# Patient Record
Sex: Female | Born: 1965 | Race: Black or African American | Hispanic: No | Marital: Married | Smoking: Never smoker
Health system: Southern US, Community
[De-identification: ages and names within clinical notes are randomized; demographics above are authoritative.]

## PROBLEM LIST (undated history)

## (undated) DIAGNOSIS — I10 Essential (primary) hypertension: Secondary | ICD-10-CM

## (undated) DIAGNOSIS — I499 Cardiac arrhythmia, unspecified: Secondary | ICD-10-CM

## (undated) DIAGNOSIS — F419 Anxiety disorder, unspecified: Secondary | ICD-10-CM

## (undated) DIAGNOSIS — R112 Nausea with vomiting, unspecified: Secondary | ICD-10-CM

## (undated) DIAGNOSIS — F329 Major depressive disorder, single episode, unspecified: Secondary | ICD-10-CM

## (undated) DIAGNOSIS — F32A Depression, unspecified: Secondary | ICD-10-CM

## (undated) DIAGNOSIS — E669 Obesity, unspecified: Secondary | ICD-10-CM

## (undated) DIAGNOSIS — M1990 Unspecified osteoarthritis, unspecified site: Secondary | ICD-10-CM

## (undated) DIAGNOSIS — Z8669 Personal history of other diseases of the nervous system and sense organs: Secondary | ICD-10-CM

## (undated) DIAGNOSIS — K219 Gastro-esophageal reflux disease without esophagitis: Secondary | ICD-10-CM

## (undated) DIAGNOSIS — M549 Dorsalgia, unspecified: Secondary | ICD-10-CM

## (undated) DIAGNOSIS — G4730 Sleep apnea, unspecified: Secondary | ICD-10-CM

## (undated) DIAGNOSIS — Z9889 Other specified postprocedural states: Secondary | ICD-10-CM

## (undated) HISTORY — DX: Personal history of other diseases of the nervous system and sense organs: Z86.69

## (undated) HISTORY — DX: Anxiety disorder, unspecified: F41.9

## (undated) HISTORY — DX: Obesity, unspecified: E66.9

## (undated) HISTORY — DX: Unspecified osteoarthritis, unspecified site: M19.90

## (undated) HISTORY — PX: CHOLECYSTECTOMY: SHX55

## (undated) HISTORY — DX: Major depressive disorder, single episode, unspecified: F32.9

## (undated) HISTORY — DX: Depression, unspecified: F32.A

## (undated) HISTORY — PX: HERNIA REPAIR: SHX51

## (undated) HISTORY — DX: Essential (primary) hypertension: I10

---

## 1998-02-07 ENCOUNTER — Other Ambulatory Visit: Admission: RE | Admit: 1998-02-07 | Discharge: 1998-02-07 | Payer: Self-pay | Admitting: Family Medicine

## 1999-01-22 ENCOUNTER — Other Ambulatory Visit: Admission: RE | Admit: 1999-01-22 | Discharge: 1999-01-22 | Payer: Self-pay | Admitting: *Deleted

## 2000-03-05 ENCOUNTER — Other Ambulatory Visit: Admission: RE | Admit: 2000-03-05 | Discharge: 2000-03-05 | Payer: Self-pay | Admitting: Family Medicine

## 2000-11-06 ENCOUNTER — Encounter: Admission: RE | Admit: 2000-11-06 | Discharge: 2000-11-06 | Payer: Self-pay | Admitting: Family Medicine

## 2000-11-06 ENCOUNTER — Encounter: Payer: Self-pay | Admitting: Family Medicine

## 2001-03-23 ENCOUNTER — Other Ambulatory Visit: Admission: RE | Admit: 2001-03-23 | Discharge: 2001-03-23 | Payer: Self-pay | Admitting: Family Medicine

## 2002-01-14 ENCOUNTER — Encounter: Admission: RE | Admit: 2002-01-14 | Discharge: 2002-01-14 | Payer: Self-pay | Admitting: Orthopedic Surgery

## 2002-02-24 ENCOUNTER — Encounter: Payer: Self-pay | Admitting: Orthopedic Surgery

## 2002-02-24 ENCOUNTER — Encounter: Admission: RE | Admit: 2002-02-24 | Discharge: 2002-02-24 | Payer: Self-pay | Admitting: Orthopedic Surgery

## 2002-09-19 ENCOUNTER — Other Ambulatory Visit: Admission: RE | Admit: 2002-09-19 | Discharge: 2002-09-19 | Payer: Self-pay | Admitting: Family Medicine

## 2003-09-26 ENCOUNTER — Other Ambulatory Visit: Admission: RE | Admit: 2003-09-26 | Discharge: 2003-09-26 | Payer: Self-pay | Admitting: Emergency Medicine

## 2003-12-25 ENCOUNTER — Encounter: Admission: RE | Admit: 2003-12-25 | Discharge: 2003-12-25 | Payer: Self-pay | Admitting: Psychiatry

## 2004-11-20 ENCOUNTER — Other Ambulatory Visit: Admission: RE | Admit: 2004-11-20 | Discharge: 2004-11-20 | Payer: Self-pay | Admitting: Family Medicine

## 2004-12-09 ENCOUNTER — Encounter: Admission: RE | Admit: 2004-12-09 | Discharge: 2004-12-09 | Payer: Self-pay | Admitting: Family Medicine

## 2004-12-20 ENCOUNTER — Encounter: Admission: RE | Admit: 2004-12-20 | Discharge: 2004-12-20 | Payer: Self-pay | Admitting: Emergency Medicine

## 2005-06-26 ENCOUNTER — Encounter: Admission: RE | Admit: 2005-06-26 | Discharge: 2005-06-26 | Payer: Self-pay | Admitting: Family Medicine

## 2005-09-16 ENCOUNTER — Emergency Department (HOSPITAL_COMMUNITY): Admission: EM | Admit: 2005-09-16 | Discharge: 2005-09-16 | Payer: Self-pay | Admitting: *Deleted

## 2005-12-30 ENCOUNTER — Encounter: Admission: RE | Admit: 2005-12-30 | Discharge: 2005-12-30 | Payer: Self-pay | Admitting: Family Medicine

## 2006-01-13 ENCOUNTER — Other Ambulatory Visit: Admission: RE | Admit: 2006-01-13 | Discharge: 2006-01-13 | Payer: Self-pay | Admitting: Family Medicine

## 2006-10-22 ENCOUNTER — Encounter: Admission: RE | Admit: 2006-10-22 | Discharge: 2006-10-22 | Payer: Self-pay | Admitting: Family Medicine

## 2006-11-04 ENCOUNTER — Ambulatory Visit (HOSPITAL_COMMUNITY): Admission: RE | Admit: 2006-11-04 | Discharge: 2006-11-04 | Payer: Self-pay | Admitting: General Surgery

## 2006-11-04 ENCOUNTER — Encounter (INDEPENDENT_AMBULATORY_CARE_PROVIDER_SITE_OTHER): Payer: Self-pay | Admitting: Specialist

## 2007-02-03 ENCOUNTER — Encounter: Admission: RE | Admit: 2007-02-03 | Discharge: 2007-02-03 | Payer: Self-pay | Admitting: Family Medicine

## 2007-05-06 ENCOUNTER — Other Ambulatory Visit: Admission: RE | Admit: 2007-05-06 | Discharge: 2007-05-06 | Payer: Self-pay | Admitting: Family Medicine

## 2007-07-21 ENCOUNTER — Ambulatory Visit (HOSPITAL_COMMUNITY): Admission: RE | Admit: 2007-07-21 | Discharge: 2007-07-21 | Payer: Self-pay | Admitting: Family Medicine

## 2007-07-21 ENCOUNTER — Ambulatory Visit: Payer: Self-pay | Admitting: Vascular Surgery

## 2008-07-06 ENCOUNTER — Other Ambulatory Visit: Admission: RE | Admit: 2008-07-06 | Discharge: 2008-07-06 | Payer: Self-pay | Admitting: Family Medicine

## 2008-07-20 ENCOUNTER — Encounter: Admission: RE | Admit: 2008-07-20 | Discharge: 2008-07-20 | Payer: Self-pay | Admitting: Family Medicine

## 2008-11-27 ENCOUNTER — Encounter
Admission: RE | Admit: 2008-11-27 | Discharge: 2008-11-27 | Payer: Self-pay | Admitting: Physical Medicine and Rehabilitation

## 2009-08-15 ENCOUNTER — Other Ambulatory Visit: Admission: RE | Admit: 2009-08-15 | Discharge: 2009-08-15 | Payer: Self-pay | Admitting: Family Medicine

## 2009-09-12 ENCOUNTER — Encounter: Admission: RE | Admit: 2009-09-12 | Discharge: 2009-09-12 | Payer: Self-pay | Admitting: Family Medicine

## 2009-12-11 ENCOUNTER — Encounter: Payer: Self-pay | Admitting: Internal Medicine

## 2009-12-11 ENCOUNTER — Encounter: Admission: RE | Admit: 2009-12-11 | Discharge: 2009-12-11 | Payer: Self-pay | Admitting: Family Medicine

## 2009-12-24 ENCOUNTER — Encounter: Admission: RE | Admit: 2009-12-24 | Discharge: 2009-12-24 | Payer: Self-pay | Admitting: Family Medicine

## 2009-12-24 ENCOUNTER — Encounter: Payer: Self-pay | Admitting: Internal Medicine

## 2009-12-27 ENCOUNTER — Encounter: Payer: Self-pay | Admitting: Internal Medicine

## 2010-01-09 ENCOUNTER — Ambulatory Visit: Payer: Self-pay | Admitting: Internal Medicine

## 2010-01-09 DIAGNOSIS — J309 Allergic rhinitis, unspecified: Secondary | ICD-10-CM | POA: Insufficient documentation

## 2010-01-09 DIAGNOSIS — I10 Essential (primary) hypertension: Secondary | ICD-10-CM | POA: Insufficient documentation

## 2010-01-09 DIAGNOSIS — G479 Sleep disorder, unspecified: Secondary | ICD-10-CM | POA: Insufficient documentation

## 2010-01-09 DIAGNOSIS — J45909 Unspecified asthma, uncomplicated: Secondary | ICD-10-CM | POA: Insufficient documentation

## 2010-01-09 DIAGNOSIS — R062 Wheezing: Secondary | ICD-10-CM

## 2010-01-09 DIAGNOSIS — R05 Cough: Secondary | ICD-10-CM

## 2010-01-11 ENCOUNTER — Encounter: Payer: Self-pay | Admitting: Pulmonary Disease

## 2010-10-17 ENCOUNTER — Encounter
Admission: RE | Admit: 2010-10-17 | Discharge: 2010-10-17 | Payer: Self-pay | Source: Home / Self Care | Attending: Family Medicine | Admitting: Family Medicine

## 2010-11-21 NOTE — Letter (Signed)
Summary: [facility]   Imported By: Sherian Rein 01/17/2010 07:10:45  _____________________________________________________________________  External Attachment:    Type:   Image     Comment:   External Document

## 2010-11-21 NOTE — Letter (Signed)
SummaryScience writer Pulmonary Care Appointment Letter  [facility]  [address]   [place], [place] [address]   Phone: [phone]  Fax: [phone]    01/11/2010 MRN: [id number]  Alexis Cook [address] [place], [place]  [address]  Dear [name],   Our office is attempting to contact you about an appointment.  Please call our office at [phone] to schedule this appointment with Dr.________________.  Our registration staff is prepared to assist you with any questions you may have.    Thank you,   Nature conservation officer Pulmonary Division

## 2010-11-21 NOTE — Letter (Signed)
Summary: [facility]   Imported By: Sherian Rein 01/17/2010 07:09:57  _____________________________________________________________________  External Attachment:    Type:   Image     Comment:   External Document

## 2010-11-21 NOTE — Letter (Signed)
Summary: [facility]   Imported By: Sherian Rein 01/17/2010 07:08:49  _____________________________________________________________________  External Attachment:    Type:   Image     Comment:   External Document

## 2010-11-21 NOTE — Assessment & Plan Note (Signed)
Summary: asthma, bronchitis/jd   Visit Type:  Initial Consult Copy to:  Carilyn Goodpasture Primary Provider/Referring Provider:  Marcelyn Bruins  CC:  Pulmonary consult for wheezing and SOB with activity. .  History of Present Illness: IOV 01/09/2010. c/o dyspnea. Referred for same. Felt like lung was 'covered with duct tape' or 'being strangled at bottom of my windpipe in neck". Felt there was  a "knot in my neck'. Felt 'awful'.  Acute onset. Remembers date of 11/30/2009. Recollects that sick contact at work the prior day 11/29/2009. Felt she started feeling sore throat, tiredness and sinsus headaches, and felt she was getting the worst cold of her life. Same day she developed dyspnea,  associated significant wheeze, cough, insomnia, headaches, and fatigue present. Symptoms wre relieved initially by albuterol but later needed duoneb that she was needing very frequently. Did not think prednisone helped but mucinex seems to have helped. Currently dyspnea, tiredness and choking symptoms are completely resolved. But still coughing and with sinus drainage. Wheezing mostly resolved except one occassional episode today.   Current cough is mostly dry, with occassonally white clear light yellow sputum. Mild cough only. Associated mild-moderate sinus drainage still present. Currently cough and sinus drainage improving. No clear cut aggravating or relieving factors other than mucinex and pasage of time.   Notes childhood history of asthma but outgrew it other than the fact that typical colds tend to be more severe in her comparted to "normal people'. The exception is this time when symptoms of above started.   Note she has a lisinopril bottle from October 2010 which she states she got only '76month ago" but insists she never took it. She also has atenolol bottle that she claims she took for 2 weeks only back in April 2010. She is only on HCTZ for bp control. Did not take these meds during current illness.    Preventive Screening-Counseling & Management  Alcohol-Tobacco     Smoking Status: quit  Current Medications (verified): 1)  Hydrochlorothiazide 25 Mg Tabs (Hydrochlorothiazide) .... Take 1 Tablet By Mouth Once A Day 2)  Alprazolam 0.25 Mg Tabs (Alprazolam) .... Take 1 Tablet By Mouth Three Times A Day As Needed 3)  Citalopram Hydrobromide 20 Mg Tabs (Citalopram Hydrobromide) .... Take 1 Tablet By Mouth Once A Day 4)  Atenolol 50 Mg Tabs (Atenolol) .... Take 1 Tablet By Mouth Once A Day 5)  Meloxicam 15 Mg Tabs (Meloxicam) .... Take 1 Tablet By Mouth Once A Day 6)  Symbicort 160-4.5 Mcg/act Aero (Budesonide-Formoterol Fumarate) .... 2 Puffs Twice Daily  Allergies (verified): 1)  ! Vicodin 2)  ! Vioxx  Past History:  Family History: Last updated: 01/09/2010 Mother-allergies, asthma Uncle-empysema  Social History: Last updated: 01/09/2010 Patient states former smoker.  Pt states hse tried smoking as a teen.  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 Wears mask at work Works 1st shift or late night shift as well  Risk Factors: Smoking Status: quit (01/09/2010)  Past Medical History: Allergic Rhinitis Childhood Asthma outgrew Hypertension Insomnia, unspecified 780.52 Low back pain 724.2 Sleep study 2009 - mild OSA per patient.  Morbid Obesity  Past Surgical History: Cholecystectomy 2009  Family History: Mother-allergies, asthma Uncle-empysema  Social History: Patient states former smoker.  Pt states hse tried smoking as a teen.  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 Wears mask at work Works 1st shift or late night shift as wellSmoking Status:  quit  Review of Systems       The patient complains of shortness of  breath with activity, shortness of breath at rest, productive cough, non-productive cough, chest pain, irregular heartbeats, acid heartburn, abdominal pain, difficulty swallowing, sore throat, headaches, nasal congestion/difficulty breathing through nose, sneezing, ear ache, hand/feet swelling, joint stiffness or pain, and change in color of mucus.  The patient denies coughing up blood, indigestion, loss of appetite, weight change, tooth/dental problems, itching, anxiety, depression, rash, and fever.         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  Comments Medications reviewed with patient Carron Curie CMA  January 09, 2010 12:02 PM Daytime phone number verified with patient.    Physical Exam  General:  obese.   Head:  normocephalic and atraumatic Eyes:  PERRLA/EOM intact; conjunctiva and sclera clear Ears:  TMs intact and clear with normal canals Nose:  no deformity, discharge, inflammation, or lesions Mouth:  no deformity or lesions Neck:  no masses, thyromegaly, or abnormal cervical nodes Chest Wall:  no deformities noted Lungs:  clear bilaterally to auscultation and percussion Heart:  regular rate and rhythm, S1, S2 without murmurs, rubs, gallops, or clicks Abdomen:  bowel sounds positive; abdomen soft and non-tender without masses, or organomegaly Msk:  no deformity or scoliosis noted with normal posture Pulses:  pulses normal Extremities:  no clubbing, cyanosis, edema, or deformity noted Neurologic:  CN II-XII grossly intact with normal reflexes,  coordination, muscle strength and tone Skin:  intact without lesions or rashes Cervical Nodes:  no significant adenopathy Axillary Nodes:  no significant adenopathy Psych:  alert and cooperative; normal mood and affect; normal attention span and concentration   CXR  Procedure date:  12/24/2009  Findings:       Ordered By: Ane Payment,        Ordered Date: 7 Mar11 15:50  Facility: [facility]                              Department: DG  Service Report Text  DRI Accession Number: [id number]      Clinical Data: Cough and congestion with wheezing and shortness of   breath.    CHEST - 2 VIEW    Comparison: 12/11/2009    Findings: Trachea is midline.  Heart size normal.  Lungs are clear.   No pleural fluid.    IMPRESSION:   No acute findings.    Read By:  Reyes Ivan.,  M.D.   Released By:  Reyes Ivan  Impression & Recommendations:  Problem # 1:  COUGH (ICD-786.2) Assessment New SOunds like she had post viral reactive cough  and wheeze and dyspnea. Now all resolved except cough that in part is post viral in etiology and in part due to sinsu drainage. Encouraged patience, netti pot, nasal steroids. Will review in 1 month and depenidng on that get PFTs. 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 Morbid obesity. Hypertensive. Reported sleep study  1 yeara ago outside shows mild sleep apnea only. IN order to improve sleep hygiene and reivew her sleep study will refer to Dr. Craige Cotta for sleep consultation Orders: Sleep Disorder Referral (Sleep Disorder) Consultation Level V (30865)  Problem # 4:  HYPERTENSION (ICD-401.9) Assessment: New  avoid non specific betablocker and ace inhibitors for hypertension control Her updated  medication list for this problem includes:    Hydrochlorothiazide 25 Mg Tabs (Hydrochlorothiazide) .Marland Kitchen... Take 1 tablet by mouth once a day  Orders: Consultation Level V (78469)  Medications Added to Medication List This Visit: 1)  Hydrochlorothiazide 25 Mg Tabs (Hydrochlorothiazide) .... Take 1 tablet by mouth once a day 2)  Alprazolam 0.25 Mg Tabs (Alprazolam) .... Take 1 tablet by mouth three times a day as needed 3)  Citalopram Hydrobromide 20 Mg Tabs (Citalopram hydrobromide) .... Take 1 tablet by mouth once a day 4)  Meloxicam 15 Mg Tabs (Meloxicam) .... Take 1 tablet by mouth once a day 5)  Symbicort 160-4.5 Mcg/act Aero (Budesonide-formoterol fumarate) .... 2 puffs twice daily 6)  Fluticasone Propionate 50 Mcg/act Susp (Fluticasone propionate) .... 2 sprays each nostril bid  Patient Instructions: 1)  Take sample nasal steroid (2) from our office 2)  Use netti pot saline wash  daily 3)  Monitor your symptoms 4)  Return to see me in next month to see progress 5)  See Dr. Craige Cotta for sleep hygiene issues 6)  For hypertension, avoid beta blockers and ace inhibitors  7)  Other types of bp meds - calcium channel blockers and ARBs are better Prescriptions: FLUTICASONE PROPIONATE 50 MCG/ACT SUSP (FLUTICASONE PROPIONATE) 2 sprays each nostril bid  #1 x 1   Entered and Authorized by:   Kalman Shan MD   Signed by:   Kalman Shan MD on 01/09/2010   Method used:   Print then Give to Patient   RxID:   [id number]

## 2011-03-07 NOTE — Op Note (Signed)
Alexis Cook, Alexis Cook             ACCOUNT NO.:  [id number]   MEDICAL RECORD NO.:  [id number]          PATIENT TYPE:  AMB   LOCATION:  DAY                          FACILITY:  [facility]   PHYSICIAN:  Ollen Gross. Vernell Morgans, M.D. DATE OF BIRTH:  1966-01-09   DATE OF PROCEDURE:  11/04/2006  DATE OF DISCHARGE:                               OPERATIVE REPORT   PREOPERATIVE DIAGNOSIS:  Gallstones.   POSTOPERATIVE DIAGNOSES:  1. Cholecystitis with cholelithiasis.  2. Umbilical hernia.   PROCEDURES:  1. Laparoscopic cholecystectomy.  2. Repair of umbilical hernia   SURGEON:  Ollen Gross. Vernell Morgans, M.D.   ASSISTANT:  Ardeth Sportsman, MD   ANESTHESIA:  General endotracheal.   PROCEDURE:  After informed consent was obtained, the patient was brought  to the operating room, and placed in the supine position on the  operating table.  After induction of general anesthesia, the patient's  abdomen was prepped with Betadine and draped in the usual sterile  manner.  The area below the umbilicus was infiltrated with 1/4%  Marcaine.  A small incision was made with a 15-blade knife.  This  incision was carried down through the subcutaneous tissue, bluntly with  the hemostat and Army-Navy retractors until the linea alba was  identified.  The linea alba was incised with a 15-blade knife and each  side was grasped with Kocher clamps, and elevated anteriorly.  The  preperitoneal space was then probed bluntly with the hemostat until the  peritoneum was opened; and access was gained to the abdominal cavity.  Then a #0 Vicryl pursestring stitch was placed in the fascia around the  opening.  The Hasson cannula was placed through the opening and anchored  in place with the previous Vicryl pursestring stitch.  The abdomen was  then insufflated with carbon dioxide without difficulty.  The patient  was placed in the head-up position and rotated slightly with the right  side up.  The laparoscope was inserted through the  Hasson cannula and  the right upper quadrant was inspected.  The dome of the gallbladder and  liver readily identified.  Next, the epigastric region was infiltrated  with 1/4% Marcaine; and a small incision was made with a 15-blade knife  and a 10 mm port was placed bluntly, through this incision into the  abdominal cavity under direct vision.   Sites were then chosen lateral on the right side of the abdomen for the  replacement of 5-mm ports.  Each of these areas was infiltrated with  1/4% Marcaine.  Small stab incisions were made with a 15-blade knife;  and 5-mm ports were placed bluntly through these incisions into the  abdominal cavity under direct vision.  A blunt grasper was placed  through the lateral most 5-mm port and the gallbladder was identified.  The gallbladder was very inflamed, distended and tight.  Because of this  an aspirating device was placed through the epigastric port; and the  dome of the gallbladder was punctured with the aspirating device and  aspirated, so that it could be maneuvered.  The lateral-most blunt  grasper was then  used to grasp the dome of gallbladder, and elevate it  anteriorly and superiorly.   Another blunt grasper was placed through the other 5-mm port and used to  retract on the body and neck of the gallbladder.  A dissector was placed  through the epigastric port; and the peritoneal reflection at the  gallbladder neck was opened.  Blunt dissection was then carried out in  this area; until the gallbladder neck and the cystic duct junction was  readily identified; and a good window was created.  The cystic duct  appeared to be short, but the anatomy was very clear.  A single clip was  placed on the gallbladder neck.  A small dichotomy was made just below  the clip with the laparoscopic scissors.  A 14-gauge Angiocath was  placed percutaneously through the anterior abdominal wall under direct  vision.  A Reddick cholangiogram catheter was placed  through the  Angiocath and flushed. Attempts were made to place the Reddick catheter  within the cystic duct, but these attempts were unsuccessful.  So as not  to injure the duct any further; and since the anatomy was very clear;  and her liver functions were normal.  We decided to abandon attempts to  obtain a cholangiogram.   Three clips were placed proximally on the cystic duct; and the duct was  divided between the two sets of clips.  Posterior to this the cystic  artery was identified; and, again, dissected bluntly in a  circumferential manner until a good window was created.  Two clips were  placed proximally, and one distally on the artery; and the artery was  divided between the two.   Next a laparoscopic hook cautery device was used to separate the  gallbladder from the liver bed prior to completely detaching the  gallbladder from the liver bed.  The liver bed was inspected and several  small bleeding points were coagulated with electrocautery until the area  was completely hemostatic.  Gallbladder was then detached the rest of  the way from the liver bed without difficulty.  A laparoscopic bag was  inserted through the epigastric port.  The gallbladder was placed within  the bag; and the bag was sealed.  The abdomen was then irrigated with  copious amounts of saline until the effluent was clear.  The liver bed  was inspected; and, again, was found to be hemostatic.  The laparoscope  was then moved to the epigastric port; and a gallbladder grasper was  placed through the Hasson cannula and used to grasp the open end of the  of the.  The bag with the gallbladder, and the Hassan cannula were  removed through the infraumbilical port without difficulty.  Because of  the size of the gallbladder, the infraumbilical incision did have to be  extended superiorly, a little bit.  Once the gallbladder was removed the abdominal wall was inspected; and there was a moderate-sized umbilical   hernia just to the superior edge of the infraumbilical incision.  Because of this the infraumbilical incision was extended up into the  umbilicus.  The fascial edges of the hernia defect were identified; and  dissected free.  This was done sharply with the electrocautery.  Once  this was accomplished, the entire fascial defect of the umbilical  hernia, and the fascial defect from the [facility] cannula were closed with  figure-of-eight #0 Vicryl stitches.   Once this was accomplished, the abdomen was then reinsufflated; and the  repair was inspected  from the inside; and it looked good.  The rest of  the ports were then removed under direct vision; and were found to be  hemostatic.  Gas was allowed to escape.  The skin incisions were all  closed with interrupted 4-0 Monocryl subcuticular stitches.  The  umbilical incision was closed with a running 4-0 Monocryl subcuticular  stitch.  Sterile dressings were then applied.  The patient tolerated the  procedure well.  At the end of the case all needle, sponge, and  instrument counts were correct.  The patient was then awakened, and  taken to recovery in stable condition.      Ollen Gross. Vernell Morgans, M.D.  Electronically Signed     PST/MEDQ  D:  11/04/2006  T:  11/04/2006  Job:  [id number]

## 2011-03-28 ENCOUNTER — Other Ambulatory Visit: Payer: Self-pay | Admitting: Sports Medicine

## 2011-03-28 DIAGNOSIS — M545 Low back pain: Secondary | ICD-10-CM

## 2011-03-28 DIAGNOSIS — M25562 Pain in left knee: Secondary | ICD-10-CM

## 2011-04-04 ENCOUNTER — Other Ambulatory Visit: Payer: Self-pay

## 2011-08-12 ENCOUNTER — Other Ambulatory Visit: Payer: Self-pay | Admitting: Family Medicine

## 2011-08-18 ENCOUNTER — Ambulatory Visit
Admission: RE | Admit: 2011-08-18 | Discharge: 2011-08-18 | Disposition: A | Discharge status: Home / Self Care | Payer: 59 | Source: Ambulatory Visit | Attending: Family Medicine | Admitting: Family Medicine

## 2011-08-18 MED ORDER — IOHEXOL 300 MG/ML  SOLN
125.0000 mL | Freq: Once | INTRAMUSCULAR | Status: AC | PRN
  Administered 2011-08-18: 125 mL via INTRAVENOUS

## 2011-08-19 ENCOUNTER — Other Ambulatory Visit: Payer: Self-pay

## 2011-10-09 ENCOUNTER — Other Ambulatory Visit: Payer: Self-pay | Admitting: Family Medicine

## 2011-10-09 ENCOUNTER — Other Ambulatory Visit (HOSPITAL_COMMUNITY)
Admission: RE | Admit: 2011-10-09 | Discharge: 2011-10-09 | Disposition: A | Discharge status: Home / Self Care | Payer: 59 | Source: Ambulatory Visit | Attending: Family Medicine | Admitting: Family Medicine

## 2011-10-09 DIAGNOSIS — Z1159 Encounter for screening for other viral diseases: Secondary | ICD-10-CM | POA: Insufficient documentation

## 2011-10-09 DIAGNOSIS — Z124 Encounter for screening for malignant neoplasm of cervix: Secondary | ICD-10-CM | POA: Insufficient documentation

## 2011-11-04 ENCOUNTER — Encounter (INDEPENDENT_AMBULATORY_CARE_PROVIDER_SITE_OTHER): Payer: Self-pay | Admitting: General Surgery

## 2011-11-04 ENCOUNTER — Ambulatory Visit (INDEPENDENT_AMBULATORY_CARE_PROVIDER_SITE_OTHER): Payer: 59 | Admitting: General Surgery

## 2011-11-04 VITALS — BP 142/98 | HR 84 | Temp 97.7°F | Resp 18 | Ht 63.0 in | Wt 284.6 lb

## 2011-11-04 DIAGNOSIS — R1031 Right lower quadrant pain: Secondary | ICD-10-CM

## 2011-11-04 NOTE — Patient Instructions (Signed)
No evidence of hernia May need stone protocol CT to r/o kidney stone or pelvic ultrasound to r/o ovarian cyst

## 2011-11-04 NOTE — Progress Notes (Signed)
Subjective:     Patient ID: Alexis Cook, female   DOB: 12/29/1965, 46 y.o.   MRN: [id number]  HPI We're asked to see the patient in consultation by Dr. Carilyn Goodpasture to evaluate her for a right inguinal hernia. The patient is a 46 year old black female who initially experienced pain in the right lower quadrant about 3 months ago. She described the pain as severe and crampy in nature. She experienced chills and nausea with the pain. She also had problems with constipation at that time. Since then the severe nature of the pain has resolved. She still has some mild discomfort in the right lower quadrant. Her constipation is also resolved. She did have a CT scan as part of her evaluation that showed no evidence of hernia.  Review of Systems  Constitutional: Negative.   HENT: Negative.   Eyes: Negative.   Respiratory: Negative.   Cardiovascular: Negative.   Gastrointestinal: Positive for abdominal pain.  Genitourinary: Negative.   Musculoskeletal: Negative.   Skin: Negative.   Neurological: Negative.   Hematological: Negative.   Psychiatric/Behavioral: Negative.        Objective:   Physical Exam  Constitutional: She is oriented to person, place, and time.       Morbidly obese  HENT:  Head: Normocephalic and atraumatic.  Eyes: Conjunctivae and EOM are normal. Pupils are equal, round, and reactive to light.  Neck: Normal range of motion. Neck supple.  Cardiovascular: Normal rate, regular rhythm and normal heart sounds.   Pulmonary/Chest: Effort normal and breath sounds normal.  Abdominal: Soft. Bowel sounds are normal. She exhibits no distension and no mass. There is no tenderness.  Genitourinary:       No palpable evidence of inguinal hernia  Musculoskeletal: Normal range of motion.  Neurological: She is alert and oriented to person, place, and time.  Skin: Skin is warm and dry.  Psychiatric: She has a normal mood and affect. Her behavior is normal.       Assessment:       Right lower quadrant pain that seems to be improving slowly    Plan:     At this point I cannot explain her persistent right lower quadrant pain. Clinically and radiographically we see no evidence of hernia. I would recommend to her medical doctors that they could consider pelvic ultrasound to look for evidence of ovarian cyst or rupture as well as possible CT scan with stone protocol to rule out a kidney stone. Otherwise we will see her back on a p.r.n. basis.

## 2011-11-27 ENCOUNTER — Other Ambulatory Visit: Payer: Self-pay | Admitting: Family Medicine

## 2011-11-27 DIAGNOSIS — R1031 Right lower quadrant pain: Secondary | ICD-10-CM

## 2011-12-03 ENCOUNTER — Ambulatory Visit
Admission: RE | Admit: 2011-12-03 | Discharge: 2011-12-03 | Disposition: A | Discharge status: Home / Self Care | Payer: 59 | Source: Ambulatory Visit | Attending: Family Medicine | Admitting: Family Medicine

## 2011-12-03 DIAGNOSIS — R1031 Right lower quadrant pain: Secondary | ICD-10-CM

## 2011-12-22 ENCOUNTER — Encounter (HOSPITAL_COMMUNITY): Payer: Self-pay | Admitting: Emergency Medicine

## 2011-12-22 ENCOUNTER — Emergency Department (HOSPITAL_COMMUNITY)
Admission: EM | Admit: 2011-12-22 | Discharge: 2011-12-22 | Disposition: A | Discharge status: Home / Self Care | Payer: Worker's Compensation | Attending: Emergency Medicine | Admitting: Emergency Medicine

## 2011-12-22 DIAGNOSIS — Z79899 Other long term (current) drug therapy: Secondary | ICD-10-CM | POA: Insufficient documentation

## 2011-12-22 DIAGNOSIS — L989 Disorder of the skin and subcutaneous tissue, unspecified: Secondary | ICD-10-CM | POA: Insufficient documentation

## 2011-12-22 DIAGNOSIS — R209 Unspecified disturbances of skin sensation: Secondary | ICD-10-CM | POA: Insufficient documentation

## 2011-12-22 DIAGNOSIS — J45909 Unspecified asthma, uncomplicated: Secondary | ICD-10-CM | POA: Insufficient documentation

## 2011-12-22 DIAGNOSIS — Z7729 Contact with and (suspected ) exposure to other hazardous substances: Secondary | ICD-10-CM | POA: Insufficient documentation

## 2011-12-22 DIAGNOSIS — R061 Stridor: Secondary | ICD-10-CM | POA: Insufficient documentation

## 2011-12-22 DIAGNOSIS — Z77098 Contact with and (suspected) exposure to other hazardous, chiefly nonmedicinal, chemicals: Secondary | ICD-10-CM

## 2011-12-22 DIAGNOSIS — R238 Other skin changes: Secondary | ICD-10-CM

## 2011-12-22 DIAGNOSIS — M129 Arthropathy, unspecified: Secondary | ICD-10-CM | POA: Insufficient documentation

## 2011-12-22 DIAGNOSIS — I10 Essential (primary) hypertension: Secondary | ICD-10-CM | POA: Insufficient documentation

## 2011-12-22 NOTE — ED Provider Notes (Signed)
History     CSN: [id number]  Arrival date & time 12/22/11  1947   First MD Initiated Contact with Patient 12/22/11 2155      Chief Complaint  Patient presents with  . Chemical Exposure     HPI  History provided by the patient. Patient is a 46 year old African American female with history of asthma and hypertension who presents following skin exposure to chemicals at work. Patient reports working for [employer] and was working with chemicals with a splash and spilled onto the body. Patient reports having potassium sorbate, sodium density, urine sodium acid phosphate splash onto her chin, neck and bilateral knee areas. Patient rinsed her skin immediately with plain water. She also had a chemical burn ointment applied at work. Since that time patient reports having slight burning to bilateral knees. She states the redness and rash from the chemicals is gradually improving and much smaller than before. She denies any respiratory complaints or chest pains. No chemicals splashed into her eyes and she reports normal vision. She denies having any blistering or bleeding from chemicals.     Past Medical History  Diagnosis Date  . Arthritis   . Asthma   . Hypertension     Past Surgical History  Procedure Date  . Cesarean section   . Cholecystectomy     Family History  Problem Relation Age of Onset  . Hypertension Mother   . Cancer Father     not sure of what kind  . Hypertension Maternal Aunt   . Hypertension Paternal Aunt   . Hypertension Maternal Grandmother     History  Substance Use Topics  . Smoking status: Never Smoker   . Smokeless tobacco: Not on file  . Alcohol Use: Yes    OB History    Grav Para Term Preterm Abortions TAB SAB Ect Mult Living                  Review of Systems  All other systems reviewed and are negative.    Allergies  Hydrocodone-acetaminophen and Rofecoxib  Home Medications   Current Outpatient Rx  Name Route Sig Dispense  Refill  . AMLODIPINE BESYLATE 5 MG PO TABS Oral Take 5 mg by mouth daily.     Marland Kitchen HYDROCHLOROTHIAZIDE 25 MG PO TABS Oral Take 25 mg by mouth daily.     Carma Leaven M PLUS PO TABS Oral Take 1 tablet by mouth daily.    . QUASENSE 0.15-0.03 MG PO TABS Oral Take 1 tablet by mouth daily.     Marland Kitchen VITAMIN D (ERGOCALCIFEROL) 50000 UNITS PO CAPS  Once a week.      BP 135/89  Pulse 86  Temp(Src) 99.1 F (37.3 C) (Oral)  Resp 16  SpO2 98%  Physical Exam  Nursing note and vitals reviewed. Constitutional: She is oriented to person, place, and time. She appears well-developed and well-nourished. No distress.  HENT:  Head: Normocephalic and atraumatic.  Neck: Normal range of motion. Neck supple.  Cardiovascular: Normal rate and regular rhythm.   Pulmonary/Chest: Effort normal and breath sounds normal. Stridor present. No respiratory distress. She has no wheezes. She has no rales.  Neurological: She is alert and oriented to person, place, and time.  Skin: Skin is warm and dry.       Small erythematous patches bilateral knees. No blistering seen. No induration or skin. Skin of chin and face and neck appears normal.  Psychiatric: She has a normal mood and affect. Her behavior  is normal.    ED Course  Procedures     1. Exposure to chemical compounds   2. Skin irritation       MDM  10:30 PM patient seen and evaluated. She in no acute distress. She reports improving symptoms.        Angus Seller, [place] 12/23/11 [phone]

## 2011-12-22 NOTE — Discharge Instructions (Signed)
You were seen and evaluated today for your symptoms and concerns for chemical burns to your exposure at work. At this time your providers today do not feel you have any emergent or concerning burns from your exposure. You do have small irritations to the skin it is recommended that he continue to use keep the skin clean with water and soap. You may also use a topical antibiotic or lotion to help with symptoms of burn the skin. If you develop any blistering or open wounds over the area it is recommended that you have a recheck of your symptoms by your primary care provider or by returning to the emergency room.   Chemical Burn Many chemicals can burn the skin. A chemical burn should be flushed with cool water and checked by an emergency caregiver. Your skin is a natural barrier to infection. It is the largest organ of your body. Burns damage this natural protection. To help prevent infection, it is very important to follow your caregiver's instructions in the care of your burn.  Many industrial chemicals may cause burns. These chemicals include acids, alkalis, and organic compounds such as petroleum, phenol, bitumen, tar, and grease. When acids come in contact with the skin, they cause an immediate change in the skin.Acid burns produce significant pain and form a scab (eschar). Usually, the immediate skin changes are the only damage from an acid burn.However, exposure to formic acid, chromic acid, or hydrofluoric acid may affect the whole body and may even be life-threatening. Alkalis include lye, cement, lime, and many chemicals with "hydroxide" in their name.An alkali burn may be less apparent than an acid burn at first. However, alkalis may cause greater tissue damage.It is important to be aware of any chemicals you are using. Treat any exposure to skin, eyes, or mucous membranes (nose, mouth, throat) as a potential emergency. PREVENTION  Avoid exposure to toxic chemicals that can cause burns.    Store chemicals out of the reach of children.   Use protective gloves when handling dangerous chemicals.  HOME CARE INSTRUCTIONS   Wash your hands well before changing your bandage.   Change your bandage as often as directed by your caregiver.   Remove the old bandage. If the bandage sticks, you may soak it off with cool, clean water.   Cleanse the burn thoroughly but gently with mild soap and water.   Pat the area dry with a clean, dry cloth.   Apply a thin layer of antibacterial cream to the burn.   Apply a clean bandage as instructed by your caregiver.   Keep the bandage as clean and dry as possible.   Elevate the affected area for the first 24 hours, then as instructed by your caregiver.   Only take over-the-counter or prescription medicines for pain, discomfort, or fever as directed by your caregiver.   Keep all follow-up appointments.This is important. This is how your caregiver can tell if your treatment is working.  SEEK IMMEDIATE MEDICAL CARE IF:   You develop excessive pain.   You develop redness, tenderness, swelling, or red streaks near the burn.   The burned area develops yellowish-white fluid (pus) or a bad smell.   You have a fever.  MAKE SURE YOU:   Understand these instructions.   Will watch your condition.   Will get help right away if you are not doing well or get worse.  Document Released: 07/12/2004 Document Revised: 09/25/2011 Document Reviewed: 03/03/2011 [facility] Patient Information 2012 [place], [place].  RESOURCE GUIDE  Dental Problems  Patients with Medicaid: [facility] [phone] [address]                                           [phone] [address] Phone:  [phone]                                                  Phone:  [phone]  If unable to pay or uninsured, contact:  [facility] or [facility]. to become qualified for the adult dental clinic.  Chronic  Pain Problems Contact [facility]  [phone] Patients need to be referred by their primary care doctor.  Insufficient Money for Medicine Contact [employer] Way:  call "211" or [facility] [phone].  No Primary Care Doctor Call [facility]  [phone] Other agencies that provide inexpensive medical care    [facility]  [phone]    [facility]  [phone]    [facility]  [phone]    [facility]  [phone]    [facility]  4194340922    [facility]  [phone]  Psychological Services [facility]  [phone] [facility]  [phone] [facility]   [phone] (emergency services [phone])  Substance Abuse Resources [facility]  (647)667-7477 [facility] ([phone] [facility] (979)888-3796 Floydene Flock ([phone] Residential & Outpatient Substance Abuse Program  [phone]  Abuse/Neglect [facility] Child Abuse Hotline 9717312215 [facility] Child Abuse Hotline [phone] (After Hours)  Emergency Shelter [facility] [phone]  Maternity Homes [facility] [place] [facility] [place] 6397456597 [facility] [phone]  MRSA Hotline #:   (503)314-5713    [facility] Resources  [facility]     [employer]                          [facility] Dept. [address] [place]                       [address]      [address] [place] [address]  Blondell Reveal Phone:  [phone]                                   Phone:  [phone]                 Phone:  [phone]  [facility] Phone:  [phone]  [place] [phone] (After Hours)

## 2011-12-22 NOTE — ED Notes (Signed)
Patient stated pain was increasing since initial chemical exposure.  Patient moved to stretcher 5.

## 2011-12-22 NOTE — ED Notes (Signed)
PT. REPORTS CHEMICAL SPLASHED AT CHIN AND BOTH KNEES WHILE WORKING THIS EVENING ( [employer]) , STATES "BURNING' SENSATION AT BOTH KNEES WITH REDDNESS.

## 2011-12-23 NOTE — ED Provider Notes (Signed)
Medical screening examination/treatment/procedure(s) were performed by non-physician practitioner and as supervising physician I was immediately available for consultation/collaboration.  Karleen Seebeck P Taji Barretto, MD 12/23/11 1506 

## 2012-03-04 ENCOUNTER — Other Ambulatory Visit: Payer: Self-pay

## 2012-03-04 ENCOUNTER — Encounter (HOSPITAL_BASED_OUTPATIENT_CLINIC_OR_DEPARTMENT_OTHER)
Admission: RE | Admit: 2012-03-04 | Discharge: 2012-03-04 | Disposition: A | Discharge status: Home / Self Care | Payer: 59 | Source: Ambulatory Visit | Attending: Orthopedic Surgery | Admitting: Orthopedic Surgery

## 2012-03-04 ENCOUNTER — Encounter (HOSPITAL_BASED_OUTPATIENT_CLINIC_OR_DEPARTMENT_OTHER): Payer: Self-pay | Admitting: *Deleted

## 2012-03-04 LAB — BASIC METABOLIC PANEL
BUN: 13 mg/dL (ref 6–23)
CO2: 27 mEq/L (ref 19–32)
Calcium: 9.5 mg/dL (ref 8.4–10.5)
Creatinine, Ser: 0.85 mg/dL (ref 0.50–1.10)
GFR calc non Af Amer: 83 mL/min — ABNORMAL LOW (ref >90)
Glucose, Bld: 91 mg/dL (ref 70–99)
Sodium: 138 mEq/L (ref 135–145)

## 2012-03-04 NOTE — Progress Notes (Signed)
Pt to come in for bmet-ekg- Hurt her back-on pain meds and muscle relaxants.

## 2012-03-05 ENCOUNTER — Other Ambulatory Visit: Payer: Self-pay | Admitting: Orthopedic Surgery

## 2012-03-09 ENCOUNTER — Encounter (HOSPITAL_BASED_OUTPATIENT_CLINIC_OR_DEPARTMENT_OTHER): Payer: Self-pay | Admitting: Anesthesiology

## 2012-03-09 ENCOUNTER — Encounter (HOSPITAL_BASED_OUTPATIENT_CLINIC_OR_DEPARTMENT_OTHER): Payer: Self-pay | Admitting: Orthopedic Surgery

## 2012-03-09 ENCOUNTER — Encounter (HOSPITAL_BASED_OUTPATIENT_CLINIC_OR_DEPARTMENT_OTHER)
Admission: RE | Disposition: A | Discharge status: Hospice | Payer: Self-pay | Source: Ambulatory Visit | Attending: Orthopedic Surgery

## 2012-03-09 ENCOUNTER — Ambulatory Visit (HOSPITAL_BASED_OUTPATIENT_CLINIC_OR_DEPARTMENT_OTHER)
Admission: RE | Admit: 2012-03-09 | Discharge: 2012-03-09 | Disposition: A | Discharge status: Hospice | Payer: 59 | Source: Ambulatory Visit | Attending: Orthopedic Surgery | Admitting: Orthopedic Surgery

## 2012-03-09 ENCOUNTER — Ambulatory Visit (HOSPITAL_BASED_OUTPATIENT_CLINIC_OR_DEPARTMENT_OTHER): Payer: 59 | Admitting: Anesthesiology

## 2012-03-09 DIAGNOSIS — Z01810 Encounter for preprocedural cardiovascular examination: Secondary | ICD-10-CM | POA: Insufficient documentation

## 2012-03-09 DIAGNOSIS — G5600 Carpal tunnel syndrome, unspecified upper limb: Secondary | ICD-10-CM | POA: Insufficient documentation

## 2012-03-09 DIAGNOSIS — J45909 Unspecified asthma, uncomplicated: Secondary | ICD-10-CM | POA: Insufficient documentation

## 2012-03-09 DIAGNOSIS — I10 Essential (primary) hypertension: Secondary | ICD-10-CM | POA: Insufficient documentation

## 2012-03-09 DIAGNOSIS — G4730 Sleep apnea, unspecified: Secondary | ICD-10-CM | POA: Insufficient documentation

## 2012-03-09 HISTORY — DX: Sleep apnea, unspecified: G47.30

## 2012-03-09 HISTORY — DX: Nausea with vomiting, unspecified: R11.2

## 2012-03-09 HISTORY — DX: Other specified postprocedural states: Z98.890

## 2012-03-09 HISTORY — DX: Dorsalgia, unspecified: M54.9

## 2012-03-09 HISTORY — PX: CARPAL TUNNEL RELEASE: SHX101

## 2012-03-09 LAB — POCT HEMOGLOBIN-HEMACUE: Hemoglobin: 13.4 g/dL (ref 12.0–15.0)

## 2012-03-09 SURGERY — CARPAL TUNNEL RELEASE
Anesthesia: Choice | Site: Hand | Laterality: Right | Wound class: Clean

## 2012-03-09 MED ORDER — BUPIVACAINE HCL (PF) 0.25 % IJ SOLN
INTRAMUSCULAR | Status: DC | PRN
  Administered 2012-03-09: 6 mL

## 2012-03-09 MED ORDER — PROPOFOL 10 MG/ML IV EMUL
INTRAVENOUS | Status: DC | PRN
  Administered 2012-03-09: 250 mg via INTRAVENOUS

## 2012-03-09 MED ORDER — DEXAMETHASONE SODIUM PHOSPHATE 4 MG/ML IJ SOLN
INTRAMUSCULAR | Status: DC | PRN
  Administered 2012-03-09: 10 mg via INTRAVENOUS

## 2012-03-09 MED ORDER — CHLORHEXIDINE GLUCONATE 4 % EX LIQD: 60.0000 mL | Freq: Once | CUTANEOUS | Status: DC

## 2012-03-09 MED ORDER — PENTAZOCINE-NALOXONE HCL 50-0.5 MG PO TABS
1.0000 | ORAL_TABLET | Freq: Once | ORAL | Status: AC
  Administered 2012-03-09: 1 via ORAL

## 2012-03-09 MED ORDER — ONDANSETRON HCL 4 MG/2ML IJ SOLN
INTRAMUSCULAR | Status: DC | PRN
  Administered 2012-03-09: 4 mg via INTRAVENOUS

## 2012-03-09 MED ORDER — CEFAZOLIN SODIUM-DEXTROSE 2-3 GM-% IV SOLR
2.0000 g | Freq: Once | INTRAVENOUS | Status: AC
  Administered 2012-03-09: 2 g via INTRAVENOUS

## 2012-03-09 MED ORDER — FENTANYL CITRATE 0.05 MG/ML IJ SOLN
INTRAMUSCULAR | Status: DC | PRN
  Administered 2012-03-09: 100 ug via INTRAVENOUS

## 2012-03-09 MED ORDER — LACTATED RINGERS IV SOLN
INTRAVENOUS | Status: DC
  Administered 2012-03-09 (×2): via INTRAVENOUS

## 2012-03-09 MED ORDER — METOCLOPRAMIDE HCL 5 MG/ML IJ SOLN: 10.0000 mg | Freq: Once | INTRAMUSCULAR | Status: DC | PRN

## 2012-03-09 MED ORDER — FENTANYL CITRATE 0.05 MG/ML IJ SOLN: 25.0000 ug | INTRAMUSCULAR | Status: DC | PRN

## 2012-03-09 MED ORDER — MIDAZOLAM HCL 5 MG/5ML IJ SOLN
INTRAMUSCULAR | Status: DC | PRN
  Administered 2012-03-09: 2 mg via INTRAVENOUS

## 2012-03-09 MED ORDER — PENTAZOCINE-NALOXONE 50-0.5 MG PO TABS: 1.0000 | ORAL_TABLET | ORAL | Status: AC | PRN

## 2012-03-09 SURGICAL SUPPLY — 34 items
BANDAGE GAUZE ELAST BULKY 4 IN (GAUZE/BANDAGES/DRESSINGS) ×2 IMPLANT
BLADE SURG 15 STRL LF DISP TIS (BLADE) ×1 IMPLANT
BLADE SURG 15 STRL SS (BLADE) ×1
BNDG COHESIVE 3X5 TAN STRL LF (GAUZE/BANDAGES/DRESSINGS) ×2 IMPLANT
BNDG ESMARK 4X9 LF (GAUZE/BANDAGES/DRESSINGS) ×2 IMPLANT
CHLORAPREP W/TINT 26ML (MISCELLANEOUS) ×2 IMPLANT
CLOTH BEACON ORANGE TIMEOUT ST (SAFETY) ×2 IMPLANT
CORDS BIPOLAR (ELECTRODE) ×2 IMPLANT
COVER MAYO STAND STRL (DRAPES) ×2 IMPLANT
COVER TABLE BACK 60X90 (DRAPES) ×2 IMPLANT
CUFF TOURNIQUET SINGLE 18IN (TOURNIQUET CUFF) ×2 IMPLANT
DRAPE EXTREMITY T 121X128X90 (DRAPE) ×2 IMPLANT
DRAPE SURG 17X23 STRL (DRAPES) ×2 IMPLANT
DRSG KUZMA FLUFF (GAUZE/BANDAGES/DRESSINGS) ×2 IMPLANT
GAUZE XEROFORM 1X8 LF (GAUZE/BANDAGES/DRESSINGS) ×2 IMPLANT
GLOVE BIO SURGEON STRL SZ 6.5 (GLOVE) IMPLANT
GLOVE SURG ORTHO 8.0 STRL STRW (GLOVE) ×2 IMPLANT
GOWN BRE IMP PREV XXLGXLNG (GOWN DISPOSABLE) IMPLANT
GOWN PREVENTION PLUS XLARGE (GOWN DISPOSABLE) IMPLANT
NEEDLE 27GAX1X1/2 (NEEDLE) ×2 IMPLANT
NS IRRIG 1000ML POUR BTL (IV SOLUTION) ×2 IMPLANT
PACK BASIN DAY SURGERY FS (CUSTOM PROCEDURE TRAY) ×2 IMPLANT
PAD CAST 3X4 CTTN HI CHSV (CAST SUPPLIES) ×1 IMPLANT
PADDING CAST ABS 4INX4YD NS (CAST SUPPLIES)
PADDING CAST ABS COTTON 4X4 ST (CAST SUPPLIES) IMPLANT
PADDING CAST COTTON 3X4 STRL (CAST SUPPLIES) ×1
SPONGE GAUZE 4X4 12PLY (GAUZE/BANDAGES/DRESSINGS) ×2 IMPLANT
STOCKINETTE 4X48 STRL (DRAPES) ×2 IMPLANT
SUT VICRYL 4-0 PS2 18IN ABS (SUTURE) IMPLANT
SUT VICRYL RAPIDE 4/0 PS 2 (SUTURE) ×2 IMPLANT
SYR BULB 3OZ (MISCELLANEOUS) ×2 IMPLANT
SYR CONTROL 10ML LL (SYRINGE) ×2 IMPLANT
TOWEL OR 17X24 6PK STRL BLUE (TOWEL DISPOSABLE) IMPLANT
UNDERPAD 30X30 INCONTINENT (UNDERPADS AND DIAPERS) ×2 IMPLANT

## 2012-03-09 NOTE — Anesthesia Procedure Notes (Signed)
Procedure Name: LMA Insertion Date/Time: 03/09/2012 9:42 AM Performed by: Gar Gibbon Pre-anesthesia Checklist: Patient identified, Emergency Drugs available, Suction available and Patient being monitored Patient Re-evaluated:Patient Re-evaluated prior to inductionOxygen Delivery Method: Circle System Utilized Preoxygenation: Pre-oxygenation with 100% oxygen Intubation Type: IV induction Ventilation: Mask ventilation without difficulty LMA: LMA with gastric port inserted LMA Size: 4.0 Number of attempts: 1 Placement Confirmation: positive ETCO2 Tube secured with: Tape Dental Injury: Teeth and Oropharynx as per pre-operative assessment

## 2012-03-09 NOTE — Discharge Instructions (Addendum)

## 2012-03-09 NOTE — Anesthesia Preprocedure Evaluation (Addendum)
Anesthesia Evaluation  Patient identified by MRN, date of birth, ID band Patient awake    Reviewed: Allergy & Precautions, H&P , NPO status , Patient's Chart, lab work & pertinent test results, reviewed documented beta blocker date and time   History of Anesthesia Complications (+) PONV  Airway Mallampati: II TM Distance: >3 FB Neck ROM: full    Dental   Pulmonary asthma , sleep apnea ,          Cardiovascular hypertension, On Medications     Neuro/Psych negative neurological ROS  negative psych ROS   GI/Hepatic negative GI ROS, Neg liver ROS,   Endo/Other  Morbid obesity  Renal/GU negative Renal ROS  negative genitourinary   Musculoskeletal   Abdominal   Peds  Hematology negative hematology ROS (+)   Anesthesia Other Findings See surgeon's H&P   Reproductive/Obstetrics negative OB ROS                           Anesthesia Physical Anesthesia Plan  ASA: III  Anesthesia Plan: General   Post-op Pain Management:    Induction: Intravenous  Airway Management Planned: LMA  Additional Equipment:   Intra-op Plan:   Post-operative Plan: Extubation in OR  Informed Consent: I have reviewed the patients History and Physical, chart, labs and discussed the procedure including the risks, benefits and alternatives for the proposed anesthesia with the patient or authorized representative who has indicated his/her understanding and acceptance.   Dental Advisory Given  Plan Discussed with: CRNA and Surgeon  Anesthesia Plan Comments:        Anesthesia Quick Evaluation

## 2012-03-09 NOTE — H&P (Signed)
Alexis Cook is a 46 year-old right-hand dominant female referred by Carilyn Goodpasture, P.A. for consultation with respect to bilateral hand numbness, tingling, right greater than left.  This has been going on since at least 1996 when I last saw her.  She states it has gotten progressively worse, it awakens her 7 out of 7 nights.  She has no history of injury to the neck. She has history of laceration to her right ring finger. She has recently been given a Medrol Dosepak. She has a history of arthritis, no history of diabetes, thyroid disease or gout.  She complains of an intermittent, moderate to extremely severe dull, throbbing, aching, burning type pain with a feeling of numbness and weakness in all fingers. She states  it is getting worse.  Shaking her hands will frequently help.  She has taken nonsteroid anti-inflammatories and is wearing a splint.   ALLERGIES:    Vioxx. MEDICATIONS:    HCTZ, amlodipine, Quasense, hydrocodone and the Medrol Dosepak. SURGICAL HISTORY:    C-section, cholecystectomy. FAMILY MEDICAL HISTORY:   Positive for heart disease, arthritis, high blood pressure.  SOCIAL HISTORY:     She does not smoke, she drinks socially, she is married and is a Pensions consultant at [employer].   REVIEW OF SYSTEMS:    Positive for glasses, contacts, high blood pressure, blood in her stools, depression, sleep disorder, otherwise negative. Alexis Cook is an 46 y.o. female.   Chief Complaint: CTS Rt HPI: see above  Past Medical History  Diagnosis Date  . Hypertension   . Asthma     as child and occ  . Back pain   . Arthritis     knee pain-uses crutches at times in house  . Sleep apnea     mild-no cpap needed  . PONV (postoperative nausea and vomiting)     Past Surgical History  Procedure Date  . Cesarean section   . Cholecystectomy   . Hernia repair     Family History  Problem Relation Age of Onset  . Hypertension Mother   . Cancer Father     not sure of what kind  .  Hypertension Maternal Aunt   . Hypertension Paternal Aunt   . Hypertension Maternal Grandmother    Social History:  reports that she has never smoked. She does not have any smokeless tobacco history on file. She reports that she drinks alcohol. She reports that she does not use illicit drugs.  Allergies:  Allergies  Allergen Reactions  . Hydrocodone-Acetaminophen     REACTION: itches  . Rofecoxib     REACTION: asthma like symptoms    Medications Prior to Admission  Medication Sig Dispense Refill  . amLODipine (NORVASC) 5 MG tablet Take 5 mg by mouth daily.       . hydrochlorothiazide (HYDRODIURIL) 25 MG tablet Take 25 mg by mouth daily.       . meloxicam (MOBIC) 7.5 MG tablet Take 7.5 mg by mouth daily.      . methocarbamol (ROBAXIN) 750 MG tablet Take 750 mg by mouth 4 (four) times daily.      . Multiple Vitamins-Minerals (MULTIVITAMINS THER. W/MINERALS) TABS Take 1 tablet by mouth daily.      Marland Kitchen oxycodone (OXY-IR) 5 MG capsule Take 5 mg by mouth every 4 (four) hours as needed.      . QUASENSE 0.15-0.03 MG tablet Take 1 tablet by mouth daily.       . Vitamin D, Ergocalciferol, (DRISDOL) 50000 UNITS CAPS Once  a week.      Marland Kitchen albuterol (PROVENTIL HFA;VENTOLIN HFA) 108 (90 BASE) MCG/ACT inhaler Inhale 2 puffs into the lungs every 6 (six) hours as needed.      Marland Kitchen albuterol (PROVENTIL) (2.5 MG/3ML) 0.083% nebulizer solution Take 2.5 mg by nebulization every 6 (six) hours as needed.        Results for orders placed during the hospital encounter of 03/09/12 (from the past 48 hour(s))  POCT HEMOGLOBIN-HEMACUE     Status: Normal   Collection Time   03/09/12  8:29 AM      Component Value Range Comment   Hemoglobin 13.4  12.0 - 15.0 (g/dL)     No results found.   Pertinent items are noted in HPI.  Blood pressure 135/85, pulse 73, temperature 98.7 F (37.1 C), temperature source Oral, resp. rate 18, height 5\' 3"  (1.6 m), weight 131.543 kg (290 lb), last menstrual period 01/19/2012, SpO2  99.00%.  General appearance: alert, cooperative and appears stated age Head: Normocephalic, without obvious abnormality Neck: no adenopathy Resp: clear to auscultation bilaterally Cardio: regular rate and rhythm, S1, S2 normal, no murmur, click, rub or gallop GI: soft, non-tender; bowel sounds normal; no masses,  no organomegaly Extremities: extremities normal, atraumatic, no cyanosis or edema Pulses: 2+ and symmetric Skin: Skin color, texture, turgor normal. No rashes or lesions Neurologic: Grossly normal Incision/Wound: na  Assesment and plan: DIAGNOSIS:       Probable carpal tunnel syndrome.   NERVE STUDIES:   Nerve studies were completed by Dr. Johna Roles revealing a motor delay of 6.1 on her right side, sensory delay of 3.7.  Her left side shows a carpal tunnel also.    PLAN:   She is desirous of proceeding to have her right side released.  The pre, peri and postoperative course were discussed along with the risks and complications.  The patient is aware there is no guarantee with the surgery, possibility of infection, recurrence, injury to arteries, nerves, tendons, incomplete relief of symptoms and dystrophy.  Questions were encouraged and answered to her satisfaction.    She is scheduled for right carpal tunnel release as an outpatient.   Mabeline Varas R 03/09/2012, 8:40 AM

## 2012-03-09 NOTE — Op Note (Signed)
Dictated number: [id number]

## 2012-03-09 NOTE — Brief Op Note (Signed)
03/09/2012  10:06 AM  PATIENT:  Alexis Cook  46 y.o. female  PRE-OPERATIVE DIAGNOSIS:  right carpal tunnel syndrome   POST-OPERATIVE DIAGNOSIS:  right carpal tunnel syndrome  PROCEDURE:  Procedure(s) (LRB): CARPAL TUNNEL RELEASE (Right)  SURGEON:  Surgeon(s) and Role:    * Nicki Reaper, MD - Primary    * Tami Ribas, MD  PHYSICIAN ASSISTANT: K Albaraa Swingle,MD  ASSISTANTS: none  ANESTHESIA:   local and general  EBL:     BLOOD ADMINISTERED:none  DRAINS: none   LOCAL MEDICATIONS USED:  MARCAINE     SPECIMEN:  No Specimen  DISPOSITION OF SPECIMEN:  N/A  COUNTS:  YES  TOURNIQUET:  * Missing tourniquet times found for documented tourniquets in log:  [id number] *  DICTATION: .Other Dictation: Dictation Number [id number]  PLAN OF CARE: Discharge to home after PACU  PATIENT DISPOSITION:  PACU - hemodynamically stable.

## 2012-03-09 NOTE — Op Note (Signed)
Alexis Cook, Alexis Cook             ACCOUNT NO.:  [id number]  MEDICAL RECORD NO.:  [id number]  LOCATION:                                 FACILITY:  PHYSICIAN:  Cindee Salt, M.D.       DATE OF BIRTH:  Oct 17, 1966  DATE OF PROCEDURE:  03/09/2012 DATE OF DISCHARGE:                              OPERATIVE REPORT   PREOPERATIVE DIAGNOSIS:  Carpal tunnel syndrome, right hand.  POSTOPERATIVE DIAGNOSIS:  Carpal tunnel syndrome, right hand.  OPERATION:  Decompression of right median nerve.  SURGEON:  Cindee Salt, M.D.  ASSISTANT:  Betha Loa M.D.  ANESTHESIA:  General with local infiltration.  ANESTHESIOLOGIST:  Janetta Hora. Gelene Mink, M.D.  HISTORY:  The patient is a 46 year old female with a history of carpal tunnel syndrome, EMG nerve conductions positive, unresponsive to conservative treatment.  She has elected to undergo surgical decompression of median nerve on her right side.  Pre, peri, and postoperative course had been discussed along with risks and complications.  She is aware that there is no guarantee with the surgery; possibility of infection; recurrence of injury to arteries, nerves, and tendons; incomplete relief of symptoms; and dystrophy.  In the preoperative area, the patient is seen, the extremity marked by both the patient and surgeon, and antibiotic given.  PROCEDURE:  The patient was brought to the operating room where a general anesthetic was carried out without difficulty.  She was prepped using ChloraPrep, supine position, right arm free.  A 3-minute dry time was allowed.  Time-out taken, confirming the patient and procedure.  A longitudinal incision was made in the palm after exsanguination of the limb with an Esmarch bandage.  Tourniquet placed high on the forearm was inflated to 250 mmHg.  A straight incision was made, carried down through subcutaneous tissue.  Bleeders were electrocauterized.  Palmar fascia was split.  Superficial palmar arch identified.   Flexor tendon to the ring and little finger identified.  To the ulnar side of the median nerve, the carpal retinaculum was incised with sharp dissection.  Right angle and Sewell retractors were placed between skin and forearm fascia. The fascia released for approximately a centimeter and half proximal to the wrist crease under direct vision.  Canal was explored.  Area compression to the nerve was apparent.  No further lesions were identified.  The wound was irrigated.  Skin closed with interrupted 4-0 Vicryl Rapide sutures.  Local infiltration with 0.25% Marcaine without epinephrine was given, a 6-mL was used.  Sterile compressive dressing to the wrist was applied, fingers were left free.  On deflation of the tourniquet, all fingers immediately pinked.  She was taken to the recovery room for observation.          ______________________________ Cindee Salt, M.D.     GK/MEDQ  D:  03/09/2012  T:  03/09/2012  Job:  [id number]

## 2012-03-09 NOTE — Transfer of Care (Signed)
Immediate Anesthesia Transfer of Care Note  Patient: Alexis Cook  Procedure(s) Performed: Procedure(s) (LRB): CARPAL TUNNEL RELEASE (Right)  Patient Location: PACU  Anesthesia Type: General  Level of Consciousness: awake and oriented  Airway & Oxygen Therapy: Patient Spontanous Breathing and Patient connected to face mask oxygen  Post-op Assessment: Report given to PACU RN  Post vital signs: Reviewed and stable  Complications: No apparent anesthesia complications

## 2012-03-09 NOTE — Progress Notes (Signed)
Pt prescription called into [facility] on [address] in [place], [place] Called into pharmacist- Melanie Paper prescription torn up, witnessed by Kandis Mannan, RN

## 2012-03-09 NOTE — Anesthesia Postprocedure Evaluation (Signed)
Anesthesia Post Note  Patient: Alexis Cook  Procedure(s) Performed: Procedure(s) (LRB): CARPAL TUNNEL RELEASE (Right)  Anesthesia type: General  Patient location: PACU  Post pain: Pain level controlled  Post assessment: Patient's Cardiovascular Status Stable  Last Vitals:  Filed Vitals:   03/09/12 1030  BP: 133/69  Pulse: 73  Temp:   Resp: 22    Post vital signs: Reviewed and stable  Level of consciousness: alert  Complications: No apparent anesthesia complications

## 2012-03-10 ENCOUNTER — Encounter (HOSPITAL_BASED_OUTPATIENT_CLINIC_OR_DEPARTMENT_OTHER): Payer: Self-pay | Admitting: Orthopedic Surgery

## 2012-03-31 ENCOUNTER — Encounter: Payer: 59 | Attending: Family Medicine | Admitting: *Deleted

## 2012-03-31 ENCOUNTER — Encounter: Payer: Self-pay | Admitting: *Deleted

## 2012-03-31 DIAGNOSIS — E669 Obesity, unspecified: Secondary | ICD-10-CM | POA: Insufficient documentation

## 2012-03-31 DIAGNOSIS — Z713 Dietary counseling and surveillance: Secondary | ICD-10-CM | POA: Insufficient documentation

## 2012-03-31 NOTE — Progress Notes (Signed)
  Medical Nutrition Therapy:  Appt start time: 1500 end time:  1600.   Assessment:  Primary concerns today: obesity.   MEDICATIONS: see list   DIETARY INTAKE:  Usual eating pattern includes 3 meals and 1-2 snacks per day.  Everyday foods include fried foods, bread.  Avoided foods include none.    24-hr recall:  B ( AM): 2-3 fried eggs, sometimes with cheese or onions and mushrooms; 2-4 pieces of bacon; toast. Water or juice; breakfast at cafeteria 2-3 eggs and bacon or leftover from home  Snk ( AM):  L ( PM): pork chops or chicken; chicken sandwich;  Snk ( PM): none D ( PM): 2 meat with bread with fries Snk ( PM): oreos, cheetos sometimes Beverages: soda, alcohol daily (1 bottle champagne a day), 1 beer daily; some water;   Usual physical activity: none  Estimated energy needs: 1400 calories 158 g carbohydrates 105 g protein 39 g fat  Progress Towards Goal(s):  In progress.   Nutritional Diagnosis:  [place]-3.3 Overweight/obesity related to large portions of high fat foods and physical inactivity.  As evidenced by BMI of 51.    Intervention:  Nutrition counseling provided.  Patient is here for weight management.  She sometimes skips meals, often eats very large portions of high fatty meats, and is physically inactive. Discussed briefly carb counting and portion control.  Discussed lower fat food preparation methods, increasing vegetables, whole grains, and leaner meats.  Also encourage physical activity and limiting alcohol consumption. She on two antihypertensives and encouraged her to reduce her sodium intake.  Handouts given during visit include:  Chair exercises  My meal plan card  Low-sodium flavoring   Goals:  Eat 3 meals/day, Avoid meal skipping   Increase protein rich foods  Follow "Plate Method" for portion control  Limit carbohydrate1-2 servings/meal   Choose more whole grains, lean protein, low-fat dairy, and fruits/non-starchy vegetables.   Aim for >30  min of physical activity daily  Limit sugar-sweetened beverages and concentrated sweets  Oven-fry meats using egg white mixture and bake  Limit alcohol to 1 drink a day  [employer] Essential carb control shake   Monitoring/Evaluation:  Dietary intake, exercise, and body weight prn.

## 2012-03-31 NOTE — Patient Instructions (Addendum)
Goals:  Eat 3 meals/day, Avoid meal skipping   Increase protein rich foods  Follow "Plate Method" for portion control  Limit carbohydrate1-2 servings/meal   Choose more whole grains, lean protein, low-fat dairy, and fruits/non-starchy vegetables.   Aim for >30 min of physical activity daily  Limit sugar-sweetened beverages and concentrated sweets  Oven-fry meats using egg white mixture and bake  Limit alcohol to 1 drink a day  [employer] Essential carb control shake

## 2012-05-03 ENCOUNTER — Other Ambulatory Visit: Payer: Self-pay | Admitting: Family Medicine

## 2012-05-03 DIAGNOSIS — M545 Low back pain: Secondary | ICD-10-CM

## 2012-05-12 ENCOUNTER — Other Ambulatory Visit: Payer: Self-pay

## 2012-08-04 ENCOUNTER — Other Ambulatory Visit: Payer: Self-pay | Admitting: Family Medicine

## 2012-08-10 ENCOUNTER — Other Ambulatory Visit: Payer: Self-pay | Admitting: Family Medicine

## 2012-08-10 DIAGNOSIS — N644 Mastodynia: Secondary | ICD-10-CM

## 2012-10-06 ENCOUNTER — Ambulatory Visit
Admission: RE | Admit: 2012-10-06 | Discharge: 2012-10-06 | Disposition: A | Discharge status: Home / Self Care | Payer: 59 | Source: Ambulatory Visit | Attending: Family Medicine | Admitting: Family Medicine

## 2012-10-06 ENCOUNTER — Other Ambulatory Visit: Payer: Self-pay | Admitting: Family Medicine

## 2012-10-06 DIAGNOSIS — N644 Mastodynia: Secondary | ICD-10-CM

## 2013-09-08 ENCOUNTER — Other Ambulatory Visit: Payer: Self-pay | Admitting: Family Medicine

## 2013-09-08 DIAGNOSIS — N63 Unspecified lump in unspecified breast: Secondary | ICD-10-CM

## 2013-09-27 ENCOUNTER — Encounter: Payer: Self-pay | Admitting: Diagnostic Neuroimaging

## 2013-09-27 ENCOUNTER — Ambulatory Visit (INDEPENDENT_AMBULATORY_CARE_PROVIDER_SITE_OTHER): Payer: 59 | Admitting: Diagnostic Neuroimaging

## 2013-09-27 ENCOUNTER — Encounter (INDEPENDENT_AMBULATORY_CARE_PROVIDER_SITE_OTHER): Payer: Self-pay

## 2013-09-27 VITALS — BP 138/97 | HR 83 | Temp 98.2°F | Ht 63.0 in | Wt 283.0 lb

## 2013-09-27 DIAGNOSIS — R42 Dizziness and giddiness: Secondary | ICD-10-CM

## 2013-09-27 DIAGNOSIS — R200 Anesthesia of skin: Secondary | ICD-10-CM

## 2013-09-27 DIAGNOSIS — R209 Unspecified disturbances of skin sensation: Secondary | ICD-10-CM

## 2013-09-27 DIAGNOSIS — R413 Other amnesia: Secondary | ICD-10-CM

## 2013-09-27 NOTE — Patient Instructions (Signed)
I will check MRI brain.  Please pickup xanax packet for MRI before you leave today.

## 2013-09-27 NOTE — Progress Notes (Signed)
[facility]  PATIENT: Alexis Cook DOB: 10-May-1966  REFERRING CLINICIAN: Andee Poles HISTORY FROM: patient  REASON FOR VISIT: new consult   HISTORICAL  CHIEF COMPLAINT:  Chief Complaint  Patient presents with  . Dizziness  . Numbness    HISTORY OF PRESENT ILLNESS:   47 old right-handed female here for evaluation of numbness and vertigo.  Past few months patient has noted right-sided numbness in her arm, body and leg when she goes to sleep on right side. If she rotates to her left side, and numbness subsides.   Also recently patient has had 3 day bout of room spinning vertigo associated with nausea. Over time it has subsided. Now she has a mild intermittent spinning sensation. It is not triggered by any change in position.  Patient is also noted generalized vision deterioration over the past one year. She also has poor hearing.  Patient is also noted some memory difficulty over the last 12 months. She has also had intermittent muscle jerks in her right arm.  Patient mentioned strong family history for cerebral aneurysm in her mother, cousin, aunt.  REVIEW OF SYSTEMS: Full 14 system review of systems performed and notable only for memory loss confusion numbness weakness dizziness insomnia snoring shiftwork depression anxiety racing thoughts decreased energy joint pain joint swelling cramps allergies frequent infections blood in urine blood in stool snoring easy bruising swelling in legs hearing loss ringing in ears spinning sensation birth marks moles.  ALLERGIES: Allergies  Allergen Reactions  . Hydrocodone-Acetaminophen     REACTION: itches  . Rofecoxib     REACTION: asthma like symptoms    HOME MEDICATIONS: Outpatient Prescriptions Prior to Visit  Medication Sig Dispense Refill  . albuterol (PROVENTIL HFA;VENTOLIN HFA) 108 (90 BASE) MCG/ACT inhaler Inhale 2 puffs into the lungs every 6 (six) hours as needed.      Marland Kitchen albuterol (PROVENTIL) (2.5  MG/3ML) 0.083% nebulizer solution Take 2.5 mg by nebulization every 6 (six) hours as needed.      Marland Kitchen amLODipine (NORVASC) 5 MG tablet Take 5 mg by mouth daily.       . hydrochlorothiazide (HYDRODIURIL) 25 MG tablet Take 25 mg by mouth daily.       . meloxicam (MOBIC) 7.5 MG tablet Take 7.5 mg by mouth daily.      . methocarbamol (ROBAXIN) 750 MG tablet Take 750 mg by mouth 4 (four) times daily.      . Multiple Vitamins-Minerals (MULTIVITAMINS THER. W/MINERALS) TABS Take 1 tablet by mouth daily.      Marland Kitchen oxycodone (OXY-IR) 5 MG capsule Take 5 mg by mouth every 4 (four) hours as needed.      . QUASENSE 0.15-0.03 MG tablet Take 1 tablet by mouth daily.       . Vitamin D, Ergocalciferol, (DRISDOL) 50000 UNITS CAPS Once a week.       No facility-administered medications prior to visit.    PAST MEDICAL HISTORY: Past Medical History  Diagnosis Date  . Hypertension   . Asthma     as child and occ  . Back pain   . Arthritis     knee pain-uses crutches at times in house  . Sleep apnea     mild-no cpap needed  . PONV (postoperative nausea and vomiting)   . Obesity   . Anxiety and depression     PAST SURGICAL HISTORY: Past Surgical History  Procedure Laterality Date  . Cesarean section    . Cholecystectomy    .  Hernia repair    . Carpal tunnel release  03/09/2012    Procedure: CARPAL TUNNEL RELEASE;  Surgeon: Nicki Reaper, MD;  Location: [facility];  Service: Orthopedics;  Laterality: Right;    FAMILY HISTORY: Family History  Problem Relation Age of Onset  . Hypertension Mother   . Cancer Father     not sure of what kind  . Hypertension Maternal Aunt   . Hypertension Paternal Aunt   . Hypertension Maternal Grandmother     SOCIAL HISTORY:  History   Social History  . Marital Status: Married    Spouse Name: Alexis Cook    Number of Children: 1  . Years of Education: BS   Occupational History  .  [employer]   Social History Main Topics  . Smoking status:  Never Smoker   . Smokeless tobacco: Never Used  . Alcohol Use: Yes     Comment: occasionally; 2-3 weekly  . Drug Use: No  . Sexual Activity: Not on file   Other Topics Concern  . Not on file   Social History Narrative   Patient lives at home with family.   Caffeine Use: 2-3 sodas weekly     PHYSICAL EXAM  Filed Vitals:   09/27/13 0852 09/27/13 0907 09/27/13 0908  BP:  130/86 138/97  Pulse:  74 83  Temp: 98.2 F (36.8 C)    TempSrc: Oral    Height:   5\' 3"  (1.6 m)  Weight:   283 lb (128.368 kg)    Not recorded    Body mass index is 50.14 kg/(m^2).  GENERAL EXAM: Patient is in no distress; well developed, nourished and groomed; neck is supple; DIX HALLPIKE NEGATIVE.  CARDIOVASCULAR: Regular rate and rhythm, no murmurs, no carotid bruits  NEUROLOGIC: MENTAL STATUS: awake, alert, oriented to person, place and time, recent and remote memory intact, normal attention and concentration, language fluent, comprehension intact, naming intact, fund of knowledge appropriate CRANIAL NERVE: no papilledema on fundoscopic exam, pupils equal and reactive to light, visual fields full to confrontation, extraocular muscles intact, no nystagmus, facial sensation and strength symmetric, hearing intact, palate elevates symmetrically, uvula midline, shoulder shrug symmetric, tongue midline. MOTOR: normal bulk and tone, full strength in the BUE, BLE SENSORY: normal and symmetric to light touch, pinprick, temperature, vibration COORDINATION: finger-nose-finger, fine finger movements normal REFLEXES: deep tendon reflexes present and symmetric GAIT/STATION: CAUTIOUS, ANTALGIC GAIT. Narrow based gait; able to walk on toes, heels and tandem; romberg is negative    DIAGNOSTIC DATA (LABS, IMAGING, TESTING) - I reviewed patient records, labs, notes, testing and imaging myself where available.  Lab Results  Component Value Date   HGB 13.4 03/09/2012      Component Value Date/Time   NA 138  03/04/2012 1330   K 3.9 03/04/2012 1330   CL 102 03/04/2012 1330   CO2 27 03/04/2012 1330   GLUCOSE 91 03/04/2012 1330   BUN 13 03/04/2012 1330   CREATININE 0.85 03/04/2012 1330   CALCIUM 9.5 03/04/2012 1330   GFRNONAA 83* 03/04/2012 1330   GFRAA >90 03/04/2012 1330   No results found for this basename: CHOL, HDL, LDLCALC, LDLDIRECT, TRIG, CHOLHDL   No results found for this basename: HGBA1C   No results found for this basename: VITAMINB12   No results found for this basename: TSH      ASSESSMENT AND PLAN  47 y.o. year old female here with intermittent numbness, vertigo, memory loss and muscle jerks in the right arm. Neurologic  examination is unremarkable. Also has strong family history of cerebral aneurysm.   Ddx: CNS inflamm, vascular, autoimmune, metabolic  PLAN: Orders Placed This Encounter  Procedures  . MR Brain W Wo Contrast  . MR MRA HEAD WO CONTRAST    Return in about 3 months (around 12/26/2013).    Suanne Marker, MD 09/27/2013, 9:45 AM Certified in Neurology, Neurophysiology and Neuroimaging  [facility] [address], [address] [place], [place] [address] [phone]

## 2013-10-07 ENCOUNTER — Other Ambulatory Visit: Payer: Self-pay

## 2013-10-17 ENCOUNTER — Ambulatory Visit
Admission: RE | Admit: 2013-10-17 | Discharge: 2013-10-17 | Disposition: A | Discharge status: Home / Self Care | Payer: 59 | Source: Ambulatory Visit | Attending: Diagnostic Neuroimaging | Admitting: Diagnostic Neuroimaging

## 2013-10-17 DIAGNOSIS — R413 Other amnesia: Secondary | ICD-10-CM

## 2013-10-17 DIAGNOSIS — R42 Dizziness and giddiness: Secondary | ICD-10-CM

## 2013-10-17 DIAGNOSIS — R200 Anesthesia of skin: Secondary | ICD-10-CM

## 2013-10-17 MED ORDER — GADOBENATE DIMEGLUMINE 529 MG/ML IV SOLN
20.0000 mL | Freq: Once | INTRAVENOUS | Status: AC | PRN
  Administered 2013-10-17: 20 mL via INTRAVENOUS

## 2013-11-29 ENCOUNTER — Other Ambulatory Visit: Payer: Self-pay

## 2013-12-20 ENCOUNTER — Ambulatory Visit
Admission: RE | Admit: 2013-12-20 | Discharge: 2013-12-20 | Disposition: A | Discharge status: Home / Self Care | Payer: 59 | Source: Ambulatory Visit | Attending: Family Medicine | Admitting: Family Medicine

## 2013-12-20 DIAGNOSIS — N63 Unspecified lump in unspecified breast: Secondary | ICD-10-CM

## 2013-12-28 ENCOUNTER — Ambulatory Visit (INDEPENDENT_AMBULATORY_CARE_PROVIDER_SITE_OTHER): Payer: 59 | Admitting: Diagnostic Neuroimaging

## 2013-12-28 ENCOUNTER — Encounter: Payer: Self-pay | Admitting: Diagnostic Neuroimaging

## 2013-12-28 ENCOUNTER — Telehealth: Payer: Self-pay | Admitting: Neurology

## 2013-12-28 VITALS — BP 127/84 | HR 67

## 2013-12-28 DIAGNOSIS — R200 Anesthesia of skin: Secondary | ICD-10-CM

## 2013-12-28 DIAGNOSIS — R413 Other amnesia: Secondary | ICD-10-CM

## 2013-12-28 DIAGNOSIS — R209 Unspecified disturbances of skin sensation: Secondary | ICD-10-CM

## 2013-12-28 DIAGNOSIS — G4726 Circadian rhythm sleep disorder, shift work type: Secondary | ICD-10-CM

## 2013-12-28 DIAGNOSIS — G4733 Obstructive sleep apnea (adult) (pediatric): Secondary | ICD-10-CM

## 2013-12-28 DIAGNOSIS — R42 Dizziness and giddiness: Secondary | ICD-10-CM

## 2013-12-28 NOTE — Patient Instructions (Signed)
I will check sleep study.  Try to increase your physical activity.  Consider weight loss surgery options.

## 2013-12-28 NOTE — Telephone Encounter (Signed)
Dr.Pneumalli, refers patient for attended sleep study.  Height: 5'3  Weight: 283 lbs.  BMI: 50.14  Past Medical History: Hypertension  Asthma  as child and occ  Back pain  Arthritis  knee pain-uses crutches at times in house  Sleep apnea  mild-no cpap needed  PONV (postoperative nausea and vomiting)  Obesity  Anxiety and depression    Sleep Symptoms: sleep disturbance, sleep apnea, shift work, anxiety. Dx. 5 years ago with mild OSA not advised to have CPAP at that time.   Medications:   ALPRAZolam (Tab) XANAX 0.5 MG Take 1 tablet by mouth as needed. Albuterol Sulfate (Nebu Soln) PROVENTIL (2.5 MG/3ML) 0.083% Take 2.5 mg by nebulization every 6 (six) hours as needed. Albuterol Sulfate (Aero Soln) PROVENTIL HFA;VENTOLIN HFA 108 (90 BASE) MCG/ACT Inhale 2 puffs into the lungs every 6 (six) hours as needed. AmLODIPine Besylate (Tab) NORVASC 5 MG Take 5 mg by mouth daily. Cream Base (Cream) PCCA LIPODERM BASE Apply 1 application topically as needed. Ergocalciferol (Cap) DRISDOL 21308 UNITS Once a week. Hydrochlorothiazide (Tab) HYDRODIURIL 25 MG Take 25 mg by mouth daily. Levonorgest-Eth Estrad 91-Day (Tab) QUASENSE 0.15-0.03 MG Take 1 tablet by mouth daily. Meloxicam (Tab) MOBIC 7.5 MG Take 7.5 mg by mouth daily. Methocarbamol (Tab) ROBAXIN 750 MG Take 750 mg by mouth 4 (four) times daily. Multiple Vitamins-Minerals (Tab) multivitamins ther. w/minerals Take 1 tablet by mouth daily. Naproxen (Tab) NAPROSYN 500 MG Take 1 tablet by mouth daily. OxyCODONE HCl (Cap) OXY-IR 5 MG Take 5 mg by mouth every 4 (four) hours as needed. TraZODone HCl (Tab) DESYREL 50 MG Take 1 tablet by mouth daily. . .    Insurance: [employer]   Dr. Leta Baptist, ASSESSMENT AND PLAN  48 y.o. year old female here with intermittent numbness, vertigo, memory loss and muscle jerks in the right arm. Neurologic examination is unremarkable. Also has strong family history of cerebral aneurysm.  Ddx: sleep disturbance, sleep apnea,  shift work, anxiety, conversion reaction, labyrinthitis      Please review patient information and submit instructions for scheduling and orders for sleep technologist.  Thank you!

## 2013-12-28 NOTE — Progress Notes (Signed)
[facility]  PATIENT: Alexis Cook DOB: 04-May-1966  REFERRING CLINICIAN:  HISTORY FROM: patient  REASON FOR VISIT: follow up   HISTORICAL  CHIEF COMPLAINT:  Chief Complaint  Patient presents with  . Follow-up    dizziness and numbness    HISTORY OF PRESENT ILLNESS:   UPDATE 12/28/13: Since last visit, vertigo is better but still persistent. Nausea has resolved. Still with memory loss, foot pain, knee pain, nervous/anxiety, snoring. Reports mild sleep apnea diagnosis fro m5 years ago, but not advised to have CPAP at that time.   PRIOR HPI (09/27/14): 20 old right-handed female here for evaluation of numbness and vertigo.  Past few months patient has noted right-sided numbness in her arm, body and leg when she goes to sleep on right side. If she rotates to her left side, and numbness subsides.   Also recently patient has had 3 day bout of room spinning vertigo associated with nausea. Over time it has subsided. Now she has a mild intermittent spinning sensation. It is not triggered by any change in position.  Patient is also noted generalized vision deterioration over the past one year. She also has poor hearing.  Patient is also noted some memory difficulty over the last 12 months. She has also had intermittent muscle jerks in her right arm.  Patient mentioned strong family history for cerebral aneurysm in her mother, cousin, aunt.  REVIEW OF SYSTEMS: Full 14 system review of systems performed and notable only for memory loss confusion numbness abdominal pain rectal bleeding dizziness agitation confusion nervousness anxiety ringing in ears shift work snoring.    ALLERGIES: Allergies  Allergen Reactions  . Hydrocodone-Acetaminophen     REACTION: itches  . Rofecoxib     REACTION: asthma like symptoms    HOME MEDICATIONS: Outpatient Prescriptions Prior to Visit  Medication Sig Dispense Refill  . albuterol (PROVENTIL HFA;VENTOLIN HFA) 108 (90  BASE) MCG/ACT inhaler Inhale 2 puffs into the lungs every 6 (six) hours as needed.      Marland Kitchen albuterol (PROVENTIL) (2.5 MG/3ML) 0.083% nebulizer solution Take 2.5 mg by nebulization every 6 (six) hours as needed.      Marland Kitchen amLODipine (NORVASC) 5 MG tablet Take 5 mg by mouth daily.       . hydrochlorothiazide (HYDRODIURIL) 25 MG tablet Take 25 mg by mouth daily.       . meloxicam (MOBIC) 7.5 MG tablet Take 7.5 mg by mouth daily.      . methocarbamol (ROBAXIN) 750 MG tablet Take 750 mg by mouth 4 (four) times daily.      . Multiple Vitamins-Minerals (MULTIVITAMINS THER. W/MINERALS) TABS Take 1 tablet by mouth daily.      . naproxen (NAPROSYN) 500 MG tablet Take 1 tablet by mouth daily.      Marland Kitchen oxycodone (OXY-IR) 5 MG capsule Take 5 mg by mouth every 4 (four) hours as needed.      . QUASENSE 0.15-0.03 MG tablet Take 1 tablet by mouth daily.       . Vitamin D, Ergocalciferol, (DRISDOL) 50000 UNITS CAPS Once a week.       No facility-administered medications prior to visit.    PAST MEDICAL HISTORY: Past Medical History  Diagnosis Date  . Hypertension   . Asthma     as child and occ  . Back pain   . Arthritis     knee pain-uses crutches at times in house  . Sleep apnea     mild-no cpap needed  .  PONV (postoperative nausea and vomiting)   . Obesity   . Anxiety and depression     PAST SURGICAL HISTORY: Past Surgical History  Procedure Laterality Date  . Cesarean section    . Cholecystectomy    . Hernia repair    . Carpal tunnel release  03/09/2012    Procedure: CARPAL TUNNEL RELEASE;  Surgeon: Wynonia Sours, MD;  Location: [place];  Service: Orthopedics;  Laterality: Right;    FAMILY HISTORY: Family History  Problem Relation Age of Onset  . Hypertension Mother   . Cancer Father     not sure of what kind  . Hypertension Maternal Aunt   . Hypertension Paternal Aunt   . Hypertension Maternal Grandmother     SOCIAL HISTORY:  History   Social History  . Marital  Status: Married    Spouse Name: Jenny Reichmann    Number of Children: 1  . Years of Education: BS   Occupational History  .  [employer]   Social History Main Topics  . Smoking status: Never Smoker   . Smokeless tobacco: Never Used  . Alcohol Use: Yes     Comment: occasionally; 2-3 weekly  . Drug Use: No  . Sexual Activity: Not on file   Other Topics Concern  . Not on file   Social History Narrative   Patient lives at home with family.   Caffeine Use: 2-3 sodas weekly     PHYSICAL EXAM  Filed Vitals:   12/28/13 0842  BP: 127/84  Pulse: 67    Not recorded    Cannot calculate BMI with a height equal to zero.  GENERAL EXAM: Patient is in no distress; well developed, nourished and groomed; neck is supple.  CARDIOVASCULAR: Regular rate and rhythm, no murmurs, no carotid bruits  NEUROLOGIC: MENTAL STATUS: awake, alert, oriented to person, place and time, recent and remote memory intact, normal attention and concentration, language fluent, comprehension intact, naming intact, fund of knowledge appropriate CRANIAL NERVE: no papilledema on fundoscopic exam, pupils equal and reactive to light, visual fields full to confrontation, extraocular muscles intact, no nystagmus, facial sensation and strength symmetric, hearing intact, palate elevates symmetrically, uvula midline, shoulder shrug symmetric, tongue midline. MOTOR: normal bulk and tone, full strength in the BUE, BLE SENSORY: normal and symmetric to light touch, temperature, vibration COORDINATION: finger-nose-finger, fine finger movements normal REFLEXES: deep tendon reflexes present and symmetric GAIT/STATION: CAUTIOUS, ANTALGIC GAIT. LEFT FOOT BOOT.    DIAGNOSTIC DATA (LABS, IMAGING, TESTING) - I reviewed patient records, labs, notes, testing and imaging myself where available.  Lab Results  Component Value Date   HGB 13.4 03/09/2012      Component Value Date/Time   NA 138 03/04/2012 1330   K 3.9 03/04/2012 1330     CL 102 03/04/2012 1330   CO2 27 03/04/2012 1330   GLUCOSE 91 03/04/2012 1330   BUN 13 03/04/2012 1330   CREATININE 0.85 03/04/2012 1330   CALCIUM 9.5 03/04/2012 1330   GFRNONAA 83* 03/04/2012 1330   GFRAA >90 03/04/2012 1330   No results found for this basename: CHOL,  HDL,  LDLCALC,  LDLDIRECT,  TRIG,  CHOLHDL   No results found for this basename: HGBA1C   No results found for this basename: VITAMINB12   No results found for this basename: TSH    I reviewed images myself. -[facility]  10/17/13 MRI brain - normal  10/17/13 MRA head - normal   ASSESSMENT AND PLAN  48 y.o. year old  female here with intermittent numbness, vertigo, memory loss and muscle jerks in the right arm. Neurologic examination is unremarkable. Also has strong family history of cerebral aneurysm.   Ddx: sleep disturbance, sleep apnea, shift work, anxiety, conversion reaction, labyrinthitis   PLAN: Orders Placed This Encounter  Procedures  . Ambulatory referral to Sleep Studies   - significant obesity and decreased activity; needs to consider diet exercise modifications and weight loss surgery - consider psychiatry evaluation for nervous/anxiety feelings - vestibular PT for vertigo - sleep study for sleep apnea evaluation  Return in about 6 months (around 06/30/2014) for with Jarold Motto, MD 0/05/6760, 9:50 AM Certified in Neurology, Neurophysiology and Neuroimaging  [facility] [address] [place], [place] [address] [phone]

## 2013-12-28 NOTE — Telephone Encounter (Signed)
This patient is referred by Dr. Leta Baptist for a sleep study due to a report of sleep disorder due to shift work. She has previously been diagnosed with obstructive sleep apnea but was not treated with CPAP. She is morbidly obese. I will order a split-night sleep study and see the patient in sleep medicine consultation afterwards.

## 2014-06-30 ENCOUNTER — Ambulatory Visit: Payer: 59 | Admitting: Nurse Practitioner

## 2014-09-08 ENCOUNTER — Other Ambulatory Visit: Payer: Self-pay | Admitting: Sports Medicine

## 2014-09-08 DIAGNOSIS — M545 Low back pain: Secondary | ICD-10-CM

## 2014-09-17 ENCOUNTER — Ambulatory Visit
Admission: RE | Admit: 2014-09-17 | Discharge: 2014-09-17 | Disposition: A | Discharge status: Home / Self Care | Payer: 59 | Source: Ambulatory Visit | Attending: Sports Medicine | Admitting: Sports Medicine

## 2014-09-17 DIAGNOSIS — M545 Low back pain: Secondary | ICD-10-CM

## 2014-09-27 ENCOUNTER — Encounter: Payer: Self-pay | Admitting: Physical Medicine & Rehabilitation

## 2014-10-27 ENCOUNTER — Encounter: Payer: 59 | Attending: Physical Medicine & Rehabilitation

## 2014-10-27 ENCOUNTER — Ambulatory Visit (HOSPITAL_BASED_OUTPATIENT_CLINIC_OR_DEPARTMENT_OTHER): Payer: 59 | Admitting: Physical Medicine & Rehabilitation

## 2014-10-27 ENCOUNTER — Encounter: Payer: Self-pay | Admitting: Physical Medicine & Rehabilitation

## 2014-10-27 VITALS — BP 140/76 | HR 72 | Resp 14

## 2014-10-27 DIAGNOSIS — G894 Chronic pain syndrome: Secondary | ICD-10-CM | POA: Insufficient documentation

## 2014-10-27 DIAGNOSIS — Z79899 Other long term (current) drug therapy: Secondary | ICD-10-CM

## 2014-10-27 DIAGNOSIS — Z5181 Encounter for therapeutic drug level monitoring: Secondary | ICD-10-CM | POA: Insufficient documentation

## 2014-10-27 NOTE — Patient Instructions (Signed)
Back Exercises These exercises may help you when beginning to rehabilitate your injury. Your symptoms may resolve with or without further involvement from your physician, physical therapist or athletic trainer. While completing these exercises, remember:   Restoring tissue flexibility helps normal motion to return to the joints. This allows healthier, less painful movement and activity.  An effective stretch should be held for at least 30 seconds.  A stretch should never be painful. You should only feel a gentle lengthening or release in the stretched tissue. STRETCH - Extension, Prone on Elbows   Lie on your stomach on the floor, a bed will be too soft. Place your palms about shoulder width apart and at the height of your head.  Place your elbows under your shoulders. If this is too painful, stack pillows under your chest.  Allow your body to relax so that your hips drop lower and make contact more completely with the floor.  Hold this position for __________ seconds.  Slowly return to lying flat on the floor. Repeat __________ times. Complete this exercise __________ times per day.  RANGE OF MOTION - Extension, Prone Press Ups   Lie on your stomach on the floor, a bed will be too soft. Place your palms about shoulder width apart and at the height of your head.  Keeping your back as relaxed as possible, slowly straighten your elbows while keeping your hips on the floor. You may adjust the placement of your hands to maximize your comfort. As you gain motion, your hands will come more underneath your shoulders.  Hold this position __________ seconds.  Slowly return to lying flat on the floor. Repeat __________ times. Complete this exercise __________ times per day.  RANGE OF MOTION- Quadruped, Neutral Spine   Assume a hands and knees position on a firm surface. Keep your hands under your shoulders and your knees under your hips. You may place padding under your knees for  comfort.  Drop your head and point your tail bone toward the ground below you. This will round out your low back like an angry cat. Hold this position for __________ seconds.  Slowly lift your head and release your tail bone so that your back sags into a large arch, like an old horse.  Hold this position for __________ seconds.  Repeat this until you feel limber in your low back.  Now, find your "sweet spot." This will be the most comfortable position somewhere between the two previous positions. This is your neutral spine. Once you have found this position, tense your stomach muscles to support your low back.  Hold this position for __________ seconds. Repeat __________ times. Complete this exercise __________ times per day.  STRETCH - Flexion, Single Knee to Chest   Lie on a firm bed or floor with both legs extended in front of you.  Keeping one leg in contact with the floor, bring your opposite knee to your chest. Hold your leg in place by either grabbing behind your thigh or at your knee.  Pull until you feel a gentle stretch in your low back. Hold __________ seconds.  Slowly release your grasp and repeat the exercise with the opposite side. Repeat __________ times. Complete this exercise __________ times per day.  STRETCH - Hamstrings, Standing  Stand or sit and extend your right / left leg, placing your foot on a chair or foot stool  Keeping a slight arch in your low back and your hips straight forward.  Lead with your chest and   lean forward at the waist until you feel a gentle stretch in the back of your right / left knee or thigh. (When done correctly, this exercise requires leaning only a small distance.)  Hold this position for __________ seconds. Repeat __________ times. Complete this stretch __________ times per day. STRENGTHENING - Deep Abdominals, Pelvic Tilt   Lie on a firm bed or floor. Keeping your legs in front of you, bend your knees so they are both pointed  toward the ceiling and your feet are flat on the floor.  Tense your lower abdominal muscles to press your low back into the floor. This motion will rotate your pelvis so that your tail bone is scooping upwards rather than pointing at your feet or into the floor.  With a gentle tension and even breathing, hold this position for __________ seconds. Repeat __________ times. Complete this exercise __________ times per day.  STRENGTHENING - Abdominals, Crunches   Lie on a firm bed or floor. Keeping your legs in front of you, bend your knees so they are both pointed toward the ceiling and your feet are flat on the floor. Cross your arms over your chest.  Slightly tip your chin down without bending your neck.  Tense your abdominals and slowly lift your trunk high enough to just clear your shoulder blades. Lifting higher can put excessive stress on the low back and does not further strengthen your abdominal muscles.  Control your return to the starting position. Repeat __________ times. Complete this exercise __________ times per day.  STRENGTHENING - Quadruped, Opposite UE/LE Lift   Assume a hands and knees position on a firm surface. Keep your hands under your shoulders and your knees under your hips. You may place padding under your knees for comfort.  Find your neutral spine and gently tense your abdominal muscles so that you can maintain this position. Your shoulders and hips should form a rectangle that is parallel with the floor and is not twisted.  Keeping your trunk steady, lift your right hand no higher than your shoulder and then your left leg no higher than your hip. Make sure you are not holding your breath. Hold this position __________ seconds.  Continuing to keep your abdominal muscles tense and your back steady, slowly return to your starting position. Repeat with the opposite arm and leg. Repeat __________ times. Complete this exercise __________ times per day. Document Released:  10/24/2005 Document Revised: 12/29/2011 Document Reviewed: 01/18/2009 Exit[employer] Patient Information 2015 [employer]. This information is not intended to replace advice given to you by your health care provider. Make sure you discuss any questions you have with your health care provider.  

## 2014-10-27 NOTE — Progress Notes (Signed)
Subjective:    Patient ID: Alexis Cook, female    DOB: Apr 21, 1966, 49 y.o.   MRN: [id number]  HPI CC:  Knee pain Bilateral  Lumbar pain is fairly constant as well Evaled by ortho- diagnosed with knee OA and was treated with viscosupplementation Doing home ex from PT, plans to go after next paycheck.  Does better with knee exercise than with back injections Occ numbness and tingling in feet No sciatic symptoms Was responded to tramadol but no longer helps Responds to Oxycodone but doesn't like to take this Pain Inventory Average Pain 5 Pain Right Now 4 My pain is constant, sharp and Extreme pressure when standing  In the last 24 hours, has pain interfered with the following? General activity 10 Relation with others 10 Enjoyment of life 10 What TIME of day is your pain at its worst? Varies Sleep (in general) Fair  Pain is worse with: walking, bending, standing and some activites Pain improves with: rest, heat/ice and medication Relief from Meds: 9  Mobility use a cane how many minutes can you walk? 5 ability to climb steps?  yes do you drive?  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  no CLINICAL DATA:  Low back pain with sciatica.  Numbness and weakness.   EXAM: MRI LUMBAR SPINE WITHOUT CONTRAST   TECHNIQUE: Multiplanar, multisequence MR imaging of the lumbar spine was performed. No intravenous contrast was administered.   COMPARISON:  CT scan abdomen dated 08/11/2012   FINDINGS: Normal conus tip at L1-2. Normal paraspinal soft tissues. Multiple fibroids are noted in the uterine fundus.   T11-12 through L3-4:  Normal.   L4-5: The disc is normal. Moderately severe bilateral facet arthritis. No foraminal or spinal stenosis. No neural impingement.   L5-S1: 3 mm retrolisthesis. Marked narrowing  of the disc space. Small broad-based disc protrusion asymmetric to the right extending into the right neural foramen without impingement upon the exiting right L5 nerve. The thecal sac is not impinged upon. Facet joints are normal.   IMPRESSION: 1. Moderately severe bilateral facet arthritis at L4-5, chronic. 2. Chronic disc protrusion at L5-S1 without neural impingement.     Electronically Signed   By: Rozetta Nunnery M.D.   On: 09/17/2014 15:55  Physicians involved in your care Any changes since last visit?  no   Family History  Problem Relation Age of Onset  . Hypertension Mother   . Cancer Father     not sure of what kind  . Hypertension Maternal Aunt   . Hypertension Paternal Aunt   . Hypertension Maternal Grandmother    History   Social History  . Marital Status: Married    Spouse Name: Jenny Reichmann    Number of Children: 1  . Years of Education: BS   Occupational History  .  [employer]   Social History Main Topics  . Smoking status: Never Smoker   . Smokeless tobacco: Never Used  . Alcohol Use: Yes     Comment: occasionally; 2-3 weekly  . Drug Use: No  . Sexual Activity: None   Other Topics Concern  . None   Social History Narrative   Patient lives at home with family.   Caffeine Use: 2-3 sodas weekly   Past Surgical History  Procedure Laterality Date  . Cesarean section    . Cholecystectomy    . Hernia repair    .  Carpal tunnel release  03/09/2012    Procedure: CARPAL TUNNEL RELEASE;  Surgeon: Wynonia Sours, MD;  Location: [facility];  Service: Orthopedics;  Laterality: Right;   Past Medical History  Diagnosis Date  . Hypertension   . Asthma     as child and occ  . Back pain   . Arthritis     knee pain-uses crutches at times in house  . Sleep apnea     mild-no cpap needed  . PONV (postoperative nausea and vomiting)   . Obesity   . Anxiety and depression    BP 140/76 mmHg  Pulse 72  Resp 14  SpO2 99%  Opioid Risk Score:     Fall Risk Score: Low Fall Risk (0-5 points)  Review of Systems  HENT: Negative.   Eyes: Negative.   Respiratory: Negative.   Cardiovascular: Negative.   Gastrointestinal: Negative.   Endocrine: Negative.   Genitourinary: Negative.   Musculoskeletal: Positive for joint swelling and arthralgias.  Skin: Negative.   Allergic/Immunologic: Negative.   Neurological: Positive for dizziness, tremors, weakness and numbness.       Tingling, trouble walking, spasms  Hematological: Negative.   Psychiatric/Behavioral: Positive for dysphoric mood. The patient is nervous/anxious.        Objective:   Physical Exam  Constitutional: She is oriented to person, place, and time. She appears well-developed.  Morbid obesity  HENT:  Head: Normocephalic and atraumatic.  Eyes: Conjunctivae and EOM are normal. Pupils are equal, round, and reactive to light.  Neck: Normal range of motion.  Musculoskeletal:       Right shoulder: She exhibits pain.       Right hip: She exhibits decreased range of motion.       Left hip: She exhibits decreased range of motion and tenderness.       Right knee: Tenderness found. Medial joint line, lateral joint line and patellar tendon tenderness noted.       Left knee: Tenderness found. Medial joint line, lateral joint line and patellar tendon tenderness noted.       Lumbar back: She exhibits decreased range of motion and tenderness.  Tenderness L2-L5 paraspinals  Pain in Right shoulder with abd    Neurological: She is alert and oriented to person, place, and time.  Psychiatric: She has a normal mood and affect.  Nursing note and vitals reviewed.         Assessment & Plan:  1. Bilateral knee osteoarthritis. Has responded to Visco supplementation injections. Last injection performed about 5 months ago in bilateralKnees. Would recommend ultrasound guidance given her morbid obesity  2.  Chronic pain syndrome rec T#3, Patient prefers syrup does not like to take  pills. Would use guaifenesin/codeine syrup 10/100 per  Ml, 10 ml po TID, 3 RF  3. Low back pain chronic with severe spondylosis at L4-L5 without significant spinal stenosis. Would recommend L3 L4 L5 medial branch blocks once other issues are addressed. Patient is in agreement. We discussed radiofrequency procedure as well if the medial branch blocks were helpful but only of temporary benefit.

## 2014-10-30 ENCOUNTER — Other Ambulatory Visit: Payer: Self-pay | Admitting: Physical Medicine & Rehabilitation

## 2014-10-31 LAB — PMP ALCOHOL METABOLITE (ETG): Ethyl Glucuronide (EtG): NEGATIVE ng/mL

## 2014-11-01 LAB — PRESCRIPTION MONITORING PROFILE (SOLSTAS)
AMPHETAMINE/METH: NEGATIVE ng/mL
Barbiturate Screen, Urine: NEGATIVE ng/mL
Benzodiazepine Screen, Urine: NEGATIVE ng/mL
Buprenorphine, Urine: NEGATIVE ng/mL
CREATININE, URINE: 221.87 mg/dL (ref >20.0)
Cannabinoid Scrn, Ur: NEGATIVE ng/mL
Carisoprodol, Urine: NEGATIVE ng/mL
Cocaine Metabolites: NEGATIVE ng/mL
Fentanyl, Ur: NEGATIVE ng/mL
MDMA URINE: NEGATIVE ng/mL
METHADONE SCREEN, URINE: NEGATIVE ng/mL
Meperidine, Ur: NEGATIVE ng/mL
Nitrites, Initial: NEGATIVE ug/mL
OXYCODONE SCRN UR: NEGATIVE ng/mL
Opiate Screen, Urine: NEGATIVE ng/mL
Propoxyphene: NEGATIVE ng/mL
TAPENTADOLUR: NEGATIVE ng/mL
TRAMADOL UR: NEGATIVE ng/mL
Zolpidem, Urine: NEGATIVE ng/mL
pH, Initial: 6.5 pH (ref 4.5–8.9)

## 2014-11-06 NOTE — Progress Notes (Signed)
Urine drug screen is consistent for reported no medications

## 2014-11-13 ENCOUNTER — Ambulatory Visit: Payer: Self-pay | Admitting: Dietician

## 2014-11-24 ENCOUNTER — Encounter: Payer: Self-pay | Admitting: Physical Medicine & Rehabilitation

## 2014-11-24 ENCOUNTER — Ambulatory Visit (HOSPITAL_BASED_OUTPATIENT_CLINIC_OR_DEPARTMENT_OTHER): Payer: 59 | Admitting: Physical Medicine & Rehabilitation

## 2014-11-24 ENCOUNTER — Encounter: Payer: 59 | Attending: Physical Medicine & Rehabilitation

## 2014-11-24 VITALS — BP 143/76 | HR 77 | Resp 14

## 2014-11-24 DIAGNOSIS — G894 Chronic pain syndrome: Secondary | ICD-10-CM | POA: Diagnosis present

## 2014-11-24 DIAGNOSIS — M174 Other bilateral secondary osteoarthritis of knee: Secondary | ICD-10-CM

## 2014-11-24 DIAGNOSIS — Z5181 Encounter for therapeutic drug level monitoring: Secondary | ICD-10-CM | POA: Insufficient documentation

## 2014-11-24 MED ORDER — GUAIFENESIN-CODEINE 100-10 MG/5ML PO SOLN: 10.0000 mL | Freq: Four times a day (QID) | ORAL | Status: DC | PRN

## 2014-11-24 NOTE — Patient Instructions (Signed)
Hylan G-F 20 intra-articular injection What is this medicine? HYLAN G-F 20 (HI lan G F 20) is used to treat osteoarthritis of the knee. It lubricates and cushions the joint, reducing pain in the knee. This medicine may be used for other purposes; ask your health care provider or pharmacist if you have questions. COMMON BRAND NAME(S): Synvisc, Synvisc-One What should I tell my health care provider before I take this medicine? They need to know if you have any of these conditions: -severe knee inflammation -skin conditions or sensitivity -skin or joint infection -venous stasis -an unusual or allergic reaction to hylan G-F 20, hyaluronan (sodium hyaluronate), eggs, other medicines, foods, dyes, or preservatives -pregnant or trying to get pregnant -breast-feeding How should I use this medicine? This medicine is for injection into the knee joint. It is given by a health care professional in a hospital or clinic setting. Talk to your pediatrician regarding the use of this medicine in children. This medicine is not approved for use in children. Overdosage: If you think you've taken too much of this medicine contact a poison control center or emergency room at once. Overdosage: If you think you have taken too much of this medicine contact a poison control center or emergency room at once. NOTE: This medicine is only for you. Do not share this medicine with others. What if I miss a dose? Keep appointments for follow-up doses as directed. For Synvisc, you will need weekly injections for 3 doses. It is important not to miss your dose. If you will receive Synvisc-One, then only 1 injection will be needed. Call your doctor or health care professional if you are unable to keep an appointment. What may interact with this medicine? Do not take this medicine with any of the following medications: -other injections for the joint like steroids or anesthetics -certain skin disinfectants like benzalkonium  chloride This list may not describe all possible interactions. Give your health care provider a list of all the medicines, herbs, non-prescription drugs, or dietary supplements you use. Also tell them if you smoke, drink alcohol, or use illegal drugs. Some items may interact with your medicine. What should I watch for while using this medicine? Tell your doctor or healthcare professional if your symptoms do not start to get better or if they get worse. Your condition will be monitored carefully while you are receiving this medicine. Most persons get pain relief for up to 6 months after treatment. Avoid strenuous activities (high-impact sports, jogging) or major weight-bearing activities for 48 hours after the injection. What side effects may I notice from receiving this medicine? Side effects that you should report to your doctor or health care professional as soon as possible: -allergic reactions like skin rash, itching or hives, swelling of the face, lips, or tongue -difficulty breathing -fever or chills -severe joint pain or swelling -unusual bleeding or bruising Side effects that usually do not require medical attention (Report these to your doctor or health care professional if they continue or are bothersome.): -dizziness -flushing -general ill feeling or flu-like symptoms -headache -minor joint pain or swelling -muscle pain or cramps -pain, redness, irritation or bruising at site of injection This list may not describe all possible side effects. Call your doctor for medical advice about side effects. You may report side effects to FDA at 1-800-FDA-1088. Where should I keep my medicine? This drug is given in a hospital or clinic and will not be stored at home. NOTE: This sheet is a summary. It may   not cover all possible information. If you have questions about this medicine, talk to your doctor, pharmacist, or health care provider.  2015, Elsevier/Gold Standard. (2010-05-23  16:39:59)  

## 2014-11-24 NOTE — Progress Notes (Signed)
Bilateral Knee Synvisc -1 injection.     Indication end-stage osteoarthritis of the knee with pain that limits mobility and does not respond to oral medications.  Ultrasound guidance, 12 Hz linear transducer, long axis view  Medial aspect of the knee was imaged, identified joint space, identified patella, femur, tibia. 25-gauge 1.5 inch needle was inserted under ultrasound guidance and 3 mL of 1% lidocaine were infiltrated into the skin and subcutaneous tissue. Then a 21-gauge, 2 inch needle was inserted along the same needle track Into the joint under direct ultrasound visualization. . 6 mL of Synvisc-1 were injected. Patient tolerated procedure well Post procedure instructions given

## 2014-11-27 ENCOUNTER — Other Ambulatory Visit: Payer: Self-pay

## 2014-11-27 NOTE — Telephone Encounter (Signed)
I didn't see an oral T #3 suspension in epic, if pt would like a switch,  Please call pharmacy and see if there is a 12mg /120mg  codeine / tylenol suspension and prescribe 22ml QID prn rx 514ml 2 RF

## 2014-11-27 NOTE — Telephone Encounter (Signed)
I left a message on Alexis Cook's voicemail to call us back.  I spoke with a pharmacist at [facility] and they do have the tylenol with codeine suspension and so I will wait to hear back from Hetal to see if she wants [name] to place the order for that medication though it is not found in Epic

## 2014-11-27 NOTE — Telephone Encounter (Signed)
Alexis Cook [phone] [id number] - [place], [place] - [address] [place] called and she was confused about guaiFENesin-codeine 100-10 MG/5ML syrup, she thought it was suppose to be tylenol

## 2014-11-28 MED ORDER — ACETAMINOPHEN-CODEINE 120-12 MG/5ML PO SUSP: 15.0000 mL | Freq: Four times a day (QID) | ORAL | Status: DC | PRN

## 2014-11-28 NOTE — Telephone Encounter (Signed)
I spoke with Alexis Cook and she wants the tylenol# 3 suspension so it was called to her pharmacy.

## 2015-02-23 ENCOUNTER — Encounter: Payer: Self-pay | Admitting: Physical Medicine & Rehabilitation

## 2015-02-23 ENCOUNTER — Ambulatory Visit (HOSPITAL_BASED_OUTPATIENT_CLINIC_OR_DEPARTMENT_OTHER): Payer: 59 | Admitting: Physical Medicine & Rehabilitation

## 2015-02-23 ENCOUNTER — Encounter: Payer: 59 | Attending: Physical Medicine & Rehabilitation

## 2015-02-23 VITALS — BP 138/85 | HR 71 | Resp 14

## 2015-02-23 DIAGNOSIS — M174 Other bilateral secondary osteoarthritis of knee: Secondary | ICD-10-CM | POA: Insufficient documentation

## 2015-02-23 DIAGNOSIS — M47816 Spondylosis without myelopathy or radiculopathy, lumbar region: Secondary | ICD-10-CM | POA: Diagnosis not present

## 2015-02-23 DIAGNOSIS — Z5181 Encounter for therapeutic drug level monitoring: Secondary | ICD-10-CM | POA: Insufficient documentation

## 2015-02-23 DIAGNOSIS — G894 Chronic pain syndrome: Secondary | ICD-10-CM | POA: Diagnosis present

## 2015-02-23 MED ORDER — DICLOFENAC SODIUM 1 % TD GEL: 2.0000 g | Freq: Four times a day (QID) | TRANSDERMAL | Status: DC

## 2015-02-23 MED ORDER — ACETAMINOPHEN-CODEINE #3 300-30 MG PO TABS: 1.0000 | ORAL_TABLET | Freq: Three times a day (TID) | ORAL | Status: DC | PRN

## 2015-02-23 NOTE — Progress Notes (Signed)
Subjective:    Patient ID: Alexis Cook, female    DOB: 21-Dec-1965, 49 y.o.   MRN: [id number] Chief complaint is bilateral knee pain HPI Patient without new issues. Still works full time, joined a gym but has not really worked out much. Has difficulty with stationary bicycling, she used a seated elliptical in the therapy gym but this was not available at the community gym that she is at. She does not tolerate standing elliptical. Walking does exacerbate her pain  She continues to have low back pain but this is not as bad as her knee pain. This is really not her limiting factor  Pain Inventory Average Pain 5 Pain Right Now 7 My pain is sharp, dull, stabbing and aching  In the last 24 hours, has pain interfered with the following? General activity 7 Relation with others 7 Enjoyment of life 7 What TIME of day is your pain at its worst? daytime, evening Sleep (in general) Fair  Pain is worse with: walking and standing Pain improves with: rest, heat/ice, medication and injections Relief from Meds: 7  Mobility walk with assistance use a cane how many minutes can you walk? 10-15 ability to climb steps?  yes do you drive?  yes Do you have any goals in this area?  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  yes  Neuro/Psych weakness  Prior Studies Any changes since last visit?  no  Physicians involved in your care Any changes since last visit?  no   Family History  Problem Relation Age of Onset  . Hypertension Mother   . Cancer Father     not sure of what kind  . Hypertension Maternal Aunt   . Hypertension Paternal Aunt   . Hypertension Maternal Grandmother    History   Social History  . Marital Status: Married    Spouse Name: Jenny Reichmann  . Number of Children: 1  . Years of Education: BS   Occupational History  .  [employer]   Social History Main Topics  . Smoking status: Never Smoker   . Smokeless tobacco: Never  Used  . Alcohol Use: Yes     Comment: occasionally; 2-3 weekly  . Drug Use: No  . Sexual Activity: Not on file   Other Topics Concern  . None   Social History Narrative   Patient lives at home with family.   Caffeine Use: 2-3 sodas weekly   Past Surgical History  Procedure Laterality Date  . Cesarean section    . Cholecystectomy    . Hernia repair    . Carpal tunnel release  03/09/2012    Procedure: CARPAL TUNNEL RELEASE;  Surgeon: Wynonia Sours, MD;  Location: [facility];  Service: Orthopedics;  Laterality: Right;   Past Medical History  Diagnosis Date  . Hypertension   . Asthma     as child and occ  . Back pain   . Arthritis     knee pain-uses crutches at times in house  . Sleep apnea     mild-no cpap needed  . PONV (postoperative nausea and vomiting)   . Obesity   . Anxiety and depression    BP 138/85 mmHg  Pulse 71  Resp 14  SpO2 98%  Opioid Risk Score:   Fall Risk Score:  `1  Depression screen PHQ 2/9  No flowsheet data found.   Review of Systems  Respiratory: Positive for wheezing.  Cardiovascular: Positive for leg swelling.  Skin: Positive for rash.  Neurological: Positive for weakness.  All other systems reviewed and are negative.      Objective:   Physical Exam  Constitutional: She is oriented to person, place, and time. She appears well-developed and well-nourished.  Obese  HENT:  Head: Normocephalic and atraumatic.  Eyes: Conjunctivae and EOM are normal. Pupils are equal, round, and reactive to light.  Musculoskeletal:       Right knee: She exhibits no effusion. Tenderness found. Patellar tendon tenderness noted.       Left knee: She exhibits no effusion. Tenderness found. Patellar tendon tenderness noted.       Lumbar back: She exhibits decreased range of motion, tenderness, deformity and pain.  Hyperlordosis Pain with extension Tenderness in lumbosacral paraspinal muscles.  Neurological: She is alert and oriented to  person, place, and time.  Motor strength is 5/5 bilateral hip flexors knee extensors ankle dorsiflexor and plantar flexors  Psychiatric: She has a normal mood and affect.  Nursing note and vitals reviewed.         Assessment & Plan:  1. Chronic bilateral knee pain, osteoarthritis bilateral knees with partial relief following Visco supplementation. She still complains of pain she is reluctant to take Tylenol 3 on her regular basis. She works full time and does not want to be sedated. We discussed other treatment alternatives such as tramadol which does not help her and causes nausea, oxycodone also helps her but causes nausea, she is reluctant to take Nonsteroidal anti-inflammatories because of cardiac risk. We discussed that she has limited options. She was just started on Voltaren gel, I encouraged her to use this on a 4 times a day basis on both knees She has joined a gym and I haven't encouraged her to try the recumbent bike We'll switch from codeine cough syrup to Tylenol No. 3 tablets #30 with one refill, she takes less than 1 tablet per day

## 2015-02-23 NOTE — Patient Instructions (Signed)
Recommend recumbent bike

## 2015-03-26 ENCOUNTER — Other Ambulatory Visit: Payer: Self-pay

## 2015-03-26 DIAGNOSIS — Z1231 Encounter for screening mammogram for malignant neoplasm of breast: Secondary | ICD-10-CM

## 2015-04-20 ENCOUNTER — Ambulatory Visit
Admission: RE | Admit: 2015-04-20 | Discharge: 2015-04-20 | Disposition: A | Discharge status: Home / Self Care | Payer: 59 | Source: Ambulatory Visit

## 2015-04-20 DIAGNOSIS — Z1231 Encounter for screening mammogram for malignant neoplasm of breast: Secondary | ICD-10-CM

## 2015-05-25 ENCOUNTER — Encounter: Payer: 59 | Admitting: Registered Nurse

## 2015-07-10 ENCOUNTER — Encounter: Payer: Self-pay | Admitting: Registered Nurse

## 2015-07-10 ENCOUNTER — Encounter: Payer: 59 | Attending: Registered Nurse | Admitting: Registered Nurse

## 2015-07-10 VITALS — BP 119/81 | HR 89

## 2015-07-10 DIAGNOSIS — G894 Chronic pain syndrome: Secondary | ICD-10-CM

## 2015-07-10 DIAGNOSIS — F329 Major depressive disorder, single episode, unspecified: Secondary | ICD-10-CM | POA: Insufficient documentation

## 2015-07-10 DIAGNOSIS — Z5181 Encounter for therapeutic drug level monitoring: Secondary | ICD-10-CM | POA: Diagnosis not present

## 2015-07-10 DIAGNOSIS — M47816 Spondylosis without myelopathy or radiculopathy, lumbar region: Secondary | ICD-10-CM | POA: Diagnosis not present

## 2015-07-10 DIAGNOSIS — M174 Other bilateral secondary osteoarthritis of knee: Secondary | ICD-10-CM

## 2015-07-10 DIAGNOSIS — G4730 Sleep apnea, unspecified: Secondary | ICD-10-CM | POA: Insufficient documentation

## 2015-07-10 DIAGNOSIS — E669 Obesity, unspecified: Secondary | ICD-10-CM | POA: Insufficient documentation

## 2015-07-10 DIAGNOSIS — I10 Essential (primary) hypertension: Secondary | ICD-10-CM | POA: Insufficient documentation

## 2015-07-10 DIAGNOSIS — J45909 Unspecified asthma, uncomplicated: Secondary | ICD-10-CM | POA: Insufficient documentation

## 2015-07-10 DIAGNOSIS — M1990 Unspecified osteoarthritis, unspecified site: Secondary | ICD-10-CM | POA: Insufficient documentation

## 2015-07-10 DIAGNOSIS — Z79899 Other long term (current) drug therapy: Secondary | ICD-10-CM | POA: Insufficient documentation

## 2015-07-10 MED ORDER — ACETAMINOPHEN-CODEINE #3 300-30 MG PO TABS: 1.0000 | ORAL_TABLET | Freq: Three times a day (TID) | ORAL | Status: DC | PRN

## 2015-07-10 NOTE — Progress Notes (Signed)
Subjective:    Patient ID: Alexis Cook, female    DOB: October 05, 1966, 49 y.o.   MRN: [id number]  HPI: Ms. Alexis Cook is a 49 year old female who returns for follow up appointment and medication refill. She says her pain is located in her lower back and bilateral knees. She rates her pain 4. Her current exercise regime is walking.  Pain Inventory Average Pain 4 Pain Right Now 4 My pain is na  In the last 24 hours, has pain interfered with the following? General activity 4 Relation with others 4 Enjoyment of life 4 What TIME of day is your pain at its worst? na Sleep (in general) NA  Pain is worse with: walking and bending Pain improves with: na Relief from Meds: na  Mobility walk without assistance walk with assistance  Function employed # of hrs/week na  Neuro/Psych No problems in this area  Prior Studies Any changes since last visit?  no  Physicians involved in your care Any changes since last visit?  no   Family History  Problem Relation Age of Onset  . Hypertension Mother   . Cancer Father     not sure of what kind  . Hypertension Maternal Aunt   . Hypertension Paternal Aunt   . Hypertension Maternal Grandmother    Social History   Social History  . Marital Status: Married    Spouse Name: Jenny Reichmann  . Number of Children: 1  . Years of Education: BS   Occupational History  .  [employer]   Social History Main Topics  . Smoking status: Never Smoker   . Smokeless tobacco: Never Used  . Alcohol Use: Yes     Comment: occasionally; 2-3 weekly  . Drug Use: No  . Sexual Activity: Not on file   Other Topics Concern  . Not on file   Social History Narrative   Patient lives at home with family.   Caffeine Use: 2-3 sodas weekly   Past Surgical History  Procedure Laterality Date  . Cesarean section    . Cholecystectomy    . Hernia repair    . Carpal tunnel release  03/09/2012    Procedure: CARPAL TUNNEL RELEASE;  Surgeon: Wynonia Sours,  MD;  Location: [facility];  Service: Orthopedics;  Laterality: Right;   Past Medical History  Diagnosis Date  . Hypertension   . Asthma     as child and occ  . Back pain   . Arthritis     knee pain-uses crutches at times in house  . Sleep apnea     mild-no cpap needed  . PONV (postoperative nausea and vomiting)   . Obesity   . Anxiety and depression    BP 119/81 mmHg  Pulse 89  SpO2 98%  Opioid Risk Score:   Fall Risk Score:  `1  Depression screen PHQ 2/9  Depression screen PHQ 2/9 07/10/2015  Decreased Interest 0  Down, Depressed, Hopeless 0  PHQ - 2 Score 0     Review of Systems  All other systems reviewed and are negative.      Objective:   Physical Exam  Constitutional: She is oriented to person, place, and time. She appears well-developed and well-nourished.  HENT:  Head: Normocephalic and atraumatic.  Neck: Normal range of motion. Neck supple.  Cardiovascular: Normal rate and regular rhythm.   Pulmonary/Chest: Effort normal and breath sounds normal.  Musculoskeletal:  Normal Muscle Bulk and Muscle Testing Reveals:  Upper Extremities: Full ROM and Muscle Strength 5/5 Lower Extremities: Full ROM and Muscle Strength 5/5 Right Lower Extremity Flexion Produces Pain into Patella. Arises from chair slowly using Straight Cane for support Narrow Based gait  Neurological: She is alert and oriented to person, place, and time.  Skin: Skin is warm and dry.  Psychiatric: She has a normal mood and affect.  Nursing note and vitals reviewed.         Assessment & Plan:  1.Chronic bilateral knee pain, osteoarthritis bilateral knees:  Had Synvisc on 11/24/2014. Scheduled with Dr. Letta Pate for Synvisc in December. She was instructed to call office if she decides to have Synvisc earlier. She verbalizes understanding.  20 minutes of face to face patient care time was spent during this visit. All questions were encouraged and answered.  F/U in 3  months.

## 2015-09-27 ENCOUNTER — Other Ambulatory Visit: Payer: Self-pay | Admitting: Physical Medicine & Rehabilitation

## 2015-10-01 ENCOUNTER — Ambulatory Visit: Payer: 59 | Admitting: Physical Medicine & Rehabilitation

## 2015-10-01 NOTE — Telephone Encounter (Signed)
Electronic request for refill rec'd.  Is it okay to refill?

## 2015-11-02 ENCOUNTER — Ambulatory Visit: Payer: 59 | Admitting: Physical Medicine & Rehabilitation

## 2015-11-05 ENCOUNTER — Encounter: Payer: 59 | Attending: Physical Medicine & Rehabilitation

## 2015-11-05 ENCOUNTER — Encounter: Payer: Self-pay | Admitting: Physical Medicine & Rehabilitation

## 2015-11-05 ENCOUNTER — Ambulatory Visit (HOSPITAL_BASED_OUTPATIENT_CLINIC_OR_DEPARTMENT_OTHER): Payer: 59 | Admitting: Physical Medicine & Rehabilitation

## 2015-11-05 ENCOUNTER — Ambulatory Visit: Payer: 59

## 2015-11-05 VITALS — BP 146/81 | HR 67 | Resp 14

## 2015-11-05 DIAGNOSIS — M170 Bilateral primary osteoarthritis of knee: Secondary | ICD-10-CM | POA: Insufficient documentation

## 2015-11-05 DIAGNOSIS — M174 Other bilateral secondary osteoarthritis of knee: Secondary | ICD-10-CM

## 2015-11-05 MED ORDER — ACETAMINOPHEN-CODEINE #3 300-30 MG PO TABS: 1.0000 | ORAL_TABLET | Freq: Three times a day (TID) | ORAL | Status: DC | PRN

## 2015-11-05 MED ORDER — ACETAMINOPHEN-CODEINE 120-12 MG/5ML PO SOLN: 10.0000 mL | Freq: Three times a day (TID) | ORAL | Status: DC | PRN

## 2015-11-05 NOTE — Patient Instructions (Signed)
Hylan G-F 20 intra-articular injection What is this medicine? HYLAN G-F 20 (HI lan G F 20) is used to treat osteoarthritis of the knee. It lubricates and cushions the joint, reducing pain in the knee. This medicine may be used for other purposes; ask your health care provider or pharmacist if you have questions. What should I tell my health care provider before I take this medicine? They need to know if you have any of these conditions: -severe knee inflammation -skin conditions or sensitivity -skin or joint infection -venous stasis -an unusual or allergic reaction to hylan G-F 20, hyaluronan (sodium hyaluronate), eggs, other medicines, foods, dyes, or preservatives -pregnant or trying to get pregnant -breast-feeding How should I use this medicine? This medicine is for injection into the knee joint. It is given by a health care professional in a hospital or clinic setting. Talk to your pediatrician regarding the use of this medicine in children. This medicine is not approved for use in children. Overdosage: If you think you have taken too much of this medicine contact a poison control center or emergency room at once. NOTE: This medicine is only for you. Do not share this medicine with others. What if I miss a dose? Keep appointments for follow-up doses as directed. For Synvisc, you will need weekly injections for 3 doses. It is important not to miss your dose. If you will receive Synvisc-One, then only 1 injection will be needed. Call your doctor or health care professional if you are unable to keep an appointment. What may interact with this medicine? Do not take this medicine with any of the following medications: -other injections for the joint like steroids or anesthetics -certain skin disinfectants like benzalkonium chloride This list may not describe all possible interactions. Give your health care provider a list of all the medicines, herbs, non-prescription drugs, or dietary  supplements you use. Also tell them if you smoke, drink alcohol, or use illegal drugs. Some items may interact with your medicine. What should I watch for while using this medicine? Tell your doctor or healthcare professional if your symptoms do not start to get better or if they get worse. Your condition will be monitored carefully while you are receiving this medicine. Most persons get pain relief for up to 6 months after treatment. Avoid strenuous activities (high-impact sports, jogging) or major weight-bearing activities for 48 hours after the injection. What side effects may I notice from receiving this medicine? Side effects that you should report to your doctor or health care professional as soon as possible: -allergic reactions like skin rash, itching or hives, swelling of the face, lips, or tongue -difficulty breathing -fever or chills -severe joint pain or swelling -unusual bleeding or bruising Side effects that usually do not require medical attention (Report these to your doctor or health care professional if they continue or are bothersome.): -dizziness -flushing -general ill feeling or flu-like symptoms -headache -minor joint pain or swelling -muscle pain or cramps -pain, redness, irritation or bruising at site of injection This list may not describe all possible side effects. Call your doctor for medical advice about side effects. You may report side effects to FDA at 1-800-FDA-1088. Where should I keep my medicine? This drug is given in a hospital or clinic and will not be stored at home. NOTE: This sheet is a summary. It may not cover all possible information. If you have questions about this medicine, talk to your doctor, pharmacist, or health care provider.    2016, Elsevier/Gold   Standard. (2010-05-23 16:39:59)  

## 2015-11-05 NOTE — Progress Notes (Signed)
Bilateral Knee Synvisc -1 injection.     Indication end-stage osteoarthritis of the knee with pain that limits mobility and does not respond to oral medications.  Ultrasound guidance, 12 Hz linear transducer, long axis view  Medial aspect of the knee was imaged, identified joint space, identified patella, femur, tibia. 25-gauge 1.5 inch needle was inserted under ultrasound guidance and 3 mL of 1% lidocaine were infiltrated into the skin and subcutaneous tissue. Then a 21-gauge, 2 inch needle was inserted along the same needle track Into the joint under direct ultrasound visualization. . 6 mL of Synvisc-1 were injected.The procedure was first performed on the right side then  the left side Patient tolerated procedure well Post procedure instructions given

## 2015-11-12 ENCOUNTER — Encounter: Payer: Self-pay | Admitting: Physical Medicine & Rehabilitation

## 2015-11-12 ENCOUNTER — Ambulatory Visit (HOSPITAL_BASED_OUTPATIENT_CLINIC_OR_DEPARTMENT_OTHER): Payer: 59 | Admitting: Physical Medicine & Rehabilitation

## 2015-11-12 VITALS — BP 143/82 | HR 72 | Resp 14

## 2015-11-12 DIAGNOSIS — M25561 Pain in right knee: Secondary | ICD-10-CM

## 2015-11-12 DIAGNOSIS — M170 Bilateral primary osteoarthritis of knee: Secondary | ICD-10-CM | POA: Diagnosis not present

## 2015-11-12 MED ORDER — KETOROLAC TROMETHAMINE 10 MG PO TABS: 10.0000 mg | ORAL_TABLET | Freq: Four times a day (QID) | ORAL | Status: DC | PRN

## 2015-11-12 MED ORDER — KETOROLAC TROMETHAMINE 60 MG/2ML IM SOLN
60.0000 mg | Freq: Once | INTRAMUSCULAR | Status: AC
  Administered 2015-11-12: 60 mg via INTRAMUSCULAR

## 2015-11-12 NOTE — Progress Notes (Signed)
Subjective:    Patient ID: OTHELIA MI, female    DOB: [birth year], 50 y.o.   MRN: [id number]  HPI  50 year old female with history of end-stage osteoarthritis of both knees. She underwent Synvisc injection 11/05/2015 of both knees under ultrasound guidance. She received Synvisc 16 mL into each knee. The left knee felt a little sore afterwards however the right knee has had progressive pain and swelling. She has had previous Synvisc injection in February 2016 without any adverse effect. The patient denies any falls onto the right knee. She is using crutches. She feels pain mainly around the kneecap anteriorly. Pain Inventory Average Pain 6 Pain Right Now 25 My pain is constant, sharp and pressure, tightness  In the last 24 hours, has pain interfered with the following? General activity 10 Relation with others 10 Enjoyment of life 10 What TIME of day is your pain at its worst? all Sleep (in general) Poor  Pain is worse with: walking, bending, sitting, inactivity and standing Pain improves with: nothing Relief from Meds: 2  Mobility walk with assistance do you drive?  yes  Function employed # of hrs/week .  Neuro/Psych anxiety  Prior Studies Any changes since last visit?  no  Physicians involved in your care Any changes since last visit?  no   Family History  Problem Relation Age of Onset  . Hypertension Mother   . Cancer Father     not sure of what kind  . Hypertension Maternal Aunt   . Hypertension Paternal Aunt   . Hypertension Maternal Grandmother    Social History   Social History  . Marital Status: Married    Spouse Name: Jenny Reichmann  . Number of Children: 1  . Years of Education: BS   Occupational History  .  [employer]   Social History Main Topics  . Smoking status: Never Smoker   . Smokeless tobacco: Never Used  . Alcohol Use: Yes     Comment: occasionally; 2-3 weekly  . Drug Use: No  . Sexual Activity: Not Asked   Other Topics  Concern  . None   Social History Narrative   Patient lives at home with family.   Caffeine Use: 2-3 sodas weekly   Past Surgical History  Procedure Laterality Date  . Cesarean section    . Cholecystectomy    . Hernia repair    . Carpal tunnel release  03/09/2012    Procedure: CARPAL TUNNEL RELEASE;  Surgeon: Wynonia Sours, MD;  Location: [facility];  Service: Orthopedics;  Laterality: Right;   Past Medical History  Diagnosis Date  . Hypertension   . Asthma     as child and occ  . Back pain   . Arthritis     knee pain-uses crutches at times in house  . Sleep apnea     mild-no cpap needed  . PONV (postoperative nausea and vomiting)   . Obesity   . Anxiety and depression    BP 143/82 mmHg  Pulse 72  Resp 14  SpO2 99%  Opioid Risk Score:   Fall Risk Score:  `1  Depression screen PHQ 2/9  Depression screen PHQ 2/9 07/10/2015  Decreased Interest 0  Down, Depressed, Hopeless 0  PHQ - 2 Score 0     Review of Systems  Cardiovascular: Positive for leg swelling.  All other systems reviewed and are negative.      Objective:   Physical Exam  Constitutional: She is oriented to person,  place, and time. She appears well-developed and well-nourished.  Morbidly obese  HENT:  Head: Normocephalic and atraumatic.  Eyes: Conjunctivae and EOM are normal. Pupils are equal, round, and reactive to light.  Pulmonary/Chest: Effort normal. No respiratory distress. She has no wheezes.  Neurological: She is alert and oriented to person, place, and time.    Knee extension -15 on the left Knee flexion 110 left Knee extension -20 on the right Knee flexion 75 right  Ernest palpation bilateral medial joint lines Tenderness palpation over the patellar tendon area bilaterally Tenderness palpation over the lateral joint line  No evidence of erythema bilateral knees.  Patient ambulates with crutches Mood and affect without lability or agitation Gen. Without acute  distress    Assessment & Plan:  1.  Postinjection flare status post Synvisc injection one week ago. No issues in the left knee however the right knee has become more painful and patient feels like it's swollen. She has more problems bending this however unclear in terms of pre-injection what her flexion was. She has had no falls or trauma to that area. She has had previous Synvisc injections.  Injections were done under ultrasound guidance and had accurate placement into the joint.  We discussed treatment options including corticosteroids which she does not tolerate. Nonsteroidals are secondary choice. We'll try Toradol injection 60 mg IM today, if this does not offer her much relief have written prescription for Toradol 10 mg 1 by mouth every 6 hours 5 days. If she has continued issues despite the above treatments will have her come in for an ultrasound guided aspiration of the knee.  Patient states that she has tolerated tramadol in the past despite not tolerating Celebrex

## 2015-11-12 NOTE — Addendum Note (Signed)
Addended by: Geryl Rankins D on: 11/12/2015 01:18 PM   Modules accepted: Orders

## 2015-11-12 NOTE — Patient Instructions (Signed)
If pain and swelling do not resolve from Toradol injections please fill the prescription for Toradol by mouth  If pain and swelling still not resolved by next week would try ultrasound guided removal of joint fluid

## 2016-01-31 ENCOUNTER — Ambulatory Visit: Payer: Self-pay | Admitting: Registered Nurse

## 2016-04-01 ENCOUNTER — Encounter: Payer: 59 | Admitting: Registered Nurse

## 2016-09-30 ENCOUNTER — Other Ambulatory Visit: Payer: Self-pay | Admitting: Family Medicine

## 2016-09-30 ENCOUNTER — Other Ambulatory Visit (HOSPITAL_COMMUNITY)
Admission: RE | Admit: 2016-09-30 | Discharge: 2016-09-30 | Disposition: A | Discharge status: Home / Self Care | Payer: 59 | Source: Ambulatory Visit | Attending: Family Medicine | Admitting: Family Medicine

## 2016-09-30 DIAGNOSIS — Z1151 Encounter for screening for human papillomavirus (HPV): Secondary | ICD-10-CM | POA: Insufficient documentation

## 2016-09-30 DIAGNOSIS — Z01411 Encounter for gynecological examination (general) (routine) with abnormal findings: Secondary | ICD-10-CM | POA: Insufficient documentation

## 2016-10-01 ENCOUNTER — Other Ambulatory Visit: Payer: Self-pay | Admitting: Family Medicine

## 2016-10-01 DIAGNOSIS — Z1231 Encounter for screening mammogram for malignant neoplasm of breast: Secondary | ICD-10-CM

## 2016-10-02 LAB — CYTOLOGY - PAP
Diagnosis: NEGATIVE
HPV (WINDOPATH): NOT DETECTED

## 2016-10-09 ENCOUNTER — Ambulatory Visit
Admission: RE | Admit: 2016-10-09 | Discharge: 2016-10-09 | Disposition: A | Discharge status: Home / Self Care | Payer: 59 | Source: Ambulatory Visit | Attending: Family Medicine | Admitting: Family Medicine

## 2016-10-09 DIAGNOSIS — Z1231 Encounter for screening mammogram for malignant neoplasm of breast: Secondary | ICD-10-CM

## 2016-11-04 DIAGNOSIS — J329 Chronic sinusitis, unspecified: Secondary | ICD-10-CM | POA: Diagnosis not present

## 2016-11-04 DIAGNOSIS — R6889 Other general symptoms and signs: Secondary | ICD-10-CM | POA: Diagnosis not present

## 2017-02-24 ENCOUNTER — Ambulatory Visit
Admission: RE | Admit: 2017-02-24 | Discharge: 2017-02-24 | Disposition: A | Discharge status: Home / Self Care | Payer: 59 | Source: Ambulatory Visit | Attending: Family Medicine | Admitting: Family Medicine

## 2017-02-24 ENCOUNTER — Other Ambulatory Visit: Payer: Self-pay | Admitting: Family Medicine

## 2017-02-24 DIAGNOSIS — M79671 Pain in right foot: Secondary | ICD-10-CM

## 2017-02-24 DIAGNOSIS — Z0389 Encounter for observation for other suspected diseases and conditions ruled out: Secondary | ICD-10-CM | POA: Diagnosis not present

## 2017-02-24 DIAGNOSIS — E611 Iron deficiency: Secondary | ICD-10-CM | POA: Diagnosis not present

## 2017-02-24 DIAGNOSIS — M5442 Lumbago with sciatica, left side: Secondary | ICD-10-CM

## 2017-02-24 DIAGNOSIS — R002 Palpitations: Secondary | ICD-10-CM | POA: Diagnosis not present

## 2017-02-24 DIAGNOSIS — M7731 Calcaneal spur, right foot: Secondary | ICD-10-CM | POA: Diagnosis not present

## 2017-02-24 DIAGNOSIS — N951 Menopausal and female climacteric states: Secondary | ICD-10-CM | POA: Diagnosis not present

## 2017-03-18 DIAGNOSIS — M1712 Unilateral primary osteoarthritis, left knee: Secondary | ICD-10-CM | POA: Diagnosis not present

## 2017-03-18 DIAGNOSIS — M7062 Trochanteric bursitis, left hip: Secondary | ICD-10-CM | POA: Diagnosis not present

## 2017-03-20 ENCOUNTER — Ambulatory Visit (INDEPENDENT_AMBULATORY_CARE_PROVIDER_SITE_OTHER): Payer: Self-pay | Admitting: Orthopaedic Surgery

## 2017-03-20 DIAGNOSIS — Z0100 Encounter for examination of eyes and vision without abnormal findings: Secondary | ICD-10-CM | POA: Diagnosis not present

## 2017-08-06 DIAGNOSIS — T148XXA Other injury of unspecified body region, initial encounter: Secondary | ICD-10-CM | POA: Diagnosis not present

## 2017-08-06 DIAGNOSIS — H9319 Tinnitus, unspecified ear: Secondary | ICD-10-CM | POA: Diagnosis not present

## 2017-08-06 DIAGNOSIS — M7660 Achilles tendinitis, unspecified leg: Secondary | ICD-10-CM | POA: Diagnosis not present

## 2017-08-19 ENCOUNTER — Other Ambulatory Visit: Payer: Self-pay | Admitting: Family Medicine

## 2017-08-19 DIAGNOSIS — Z139 Encounter for screening, unspecified: Secondary | ICD-10-CM

## 2017-08-20 DIAGNOSIS — R5383 Other fatigue: Secondary | ICD-10-CM | POA: Diagnosis not present

## 2017-08-20 DIAGNOSIS — K219 Gastro-esophageal reflux disease without esophagitis: Secondary | ICD-10-CM | POA: Diagnosis not present

## 2017-08-20 DIAGNOSIS — N750 Cyst of Bartholin's gland: Secondary | ICD-10-CM | POA: Diagnosis not present

## 2017-10-02 DIAGNOSIS — Z0000 Encounter for general adult medical examination without abnormal findings: Secondary | ICD-10-CM | POA: Diagnosis not present

## 2017-10-15 ENCOUNTER — Ambulatory Visit
Admission: RE | Admit: 2017-10-15 | Discharge: 2017-10-15 | Disposition: A | Discharge status: Home / Self Care | Payer: 59 | Source: Ambulatory Visit | Attending: Family Medicine | Admitting: Family Medicine

## 2017-10-15 DIAGNOSIS — Z139 Encounter for screening, unspecified: Secondary | ICD-10-CM

## 2017-10-15 DIAGNOSIS — Z1231 Encounter for screening mammogram for malignant neoplasm of breast: Secondary | ICD-10-CM | POA: Diagnosis not present

## 2017-11-17 DIAGNOSIS — E559 Vitamin D deficiency, unspecified: Secondary | ICD-10-CM | POA: Diagnosis not present

## 2017-11-17 DIAGNOSIS — R5382 Chronic fatigue, unspecified: Secondary | ICD-10-CM | POA: Diagnosis not present

## 2017-11-17 DIAGNOSIS — R0602 Shortness of breath: Secondary | ICD-10-CM | POA: Diagnosis not present

## 2017-11-17 DIAGNOSIS — I10 Essential (primary) hypertension: Secondary | ICD-10-CM | POA: Diagnosis not present

## 2017-11-17 DIAGNOSIS — R1011 Right upper quadrant pain: Secondary | ICD-10-CM | POA: Diagnosis not present

## 2017-11-19 ENCOUNTER — Other Ambulatory Visit: Payer: Self-pay | Admitting: Family Medicine

## 2017-11-19 DIAGNOSIS — R1011 Right upper quadrant pain: Secondary | ICD-10-CM

## 2017-11-27 ENCOUNTER — Ambulatory Visit
Admission: RE | Admit: 2017-11-27 | Discharge: 2017-11-27 | Disposition: A | Discharge status: Home / Self Care | Payer: 59 | Source: Ambulatory Visit | Attending: Family Medicine | Admitting: Family Medicine

## 2017-11-27 DIAGNOSIS — R1011 Right upper quadrant pain: Secondary | ICD-10-CM

## 2017-12-03 DIAGNOSIS — H9193 Unspecified hearing loss, bilateral: Secondary | ICD-10-CM | POA: Diagnosis not present

## 2017-12-03 DIAGNOSIS — H9113 Presbycusis, bilateral: Secondary | ICD-10-CM | POA: Diagnosis not present

## 2017-12-03 DIAGNOSIS — H833X3 Noise effects on inner ear, bilateral: Secondary | ICD-10-CM | POA: Diagnosis not present

## 2017-12-03 DIAGNOSIS — R42 Dizziness and giddiness: Secondary | ICD-10-CM | POA: Diagnosis not present

## 2017-12-21 DIAGNOSIS — R1084 Generalized abdominal pain: Secondary | ICD-10-CM | POA: Diagnosis not present

## 2017-12-23 ENCOUNTER — Other Ambulatory Visit: Payer: Self-pay | Admitting: Family Medicine

## 2017-12-23 DIAGNOSIS — R1031 Right lower quadrant pain: Secondary | ICD-10-CM

## 2017-12-30 ENCOUNTER — Ambulatory Visit
Admission: RE | Admit: 2017-12-30 | Discharge: 2017-12-30 | Disposition: A | Discharge status: Home / Self Care | Payer: 59 | Source: Ambulatory Visit | Attending: Family Medicine | Admitting: Family Medicine

## 2017-12-30 DIAGNOSIS — R1031 Right lower quadrant pain: Secondary | ICD-10-CM

## 2018-02-19 ENCOUNTER — Ambulatory Visit: Payer: 59 | Admitting: Diagnostic Neuroimaging

## 2018-02-19 ENCOUNTER — Encounter: Payer: Self-pay | Admitting: Diagnostic Neuroimaging

## 2018-02-19 VITALS — BP 115/71 | HR 65 | Ht 63.0 in | Wt 283.4 lb

## 2018-02-19 DIAGNOSIS — G43009 Migraine without aura, not intractable, without status migrainosus: Secondary | ICD-10-CM

## 2018-02-19 DIAGNOSIS — H9313 Tinnitus, bilateral: Secondary | ICD-10-CM | POA: Diagnosis not present

## 2018-02-19 DIAGNOSIS — G44209 Tension-type headache, unspecified, not intractable: Secondary | ICD-10-CM | POA: Diagnosis not present

## 2018-02-19 NOTE — Patient Instructions (Signed)
  RINGING IN EARS / HEADACHES - monitor symptoms - follow up ophthalmology exam - consider MRI brain - consider LP (measure opening pressure)  MEMORY LOSS (due to sleep disorder, anxiety) - improve nutrition and exercise  ANXIETY - improved; may consider psychology evaluation in future  SNORING / FATIGUE / OSA - follow up with [facility]

## 2018-02-19 NOTE — Progress Notes (Signed)
[facility]  PATIENT: Alexis Cook DOB: 1966-05-01  REFERRING CLINICIAN:  HISTORY FROM: patient  REASON FOR VISIT: follow up   HISTORICAL  CHIEF COMPLAINT:  Chief Complaint  Patient presents with  . NP Dr. Erik Obey  Dizziness/ Tinnitis    Tinnitis for 6-8 mon, has hx migraines.  Dizziness better now.    . Rm 7    HISTORY OF PRESENT ILLNESS:   NEW HPI (02/19/18): 52 year old female here for evaluation of dizziness and memory loss.  I previously saw patient in 2015.  Since that time her vertigo and migraines have significantly improved.  Now she has nonspecific balance and gait difficulty, dizziness, mental fog.  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  Patient also has some numbness on her left side when she lays on that side.  UPDATE 12/28/13: Since last visit, vertigo is better but still persistent. Nausea has resolved. Still with memory loss, foot pain, knee pain, nervous/anxiety, snoring. Reports mild sleep apnea diagnosis from 5 years ago, but not advised to have CPAP at that time.   PRIOR HPI (09/27/14): 7 old right-handed female here for evaluation of numbness and vertigo.  Past few months patient has noted right-sided numbness in her arm, body and leg when she goes to sleep on right side. If she rotates to her left side, and numbness subsides.   Also recently patient has had 3 day bout of room spinning vertigo associated with nausea. Over time it has subsided. Now she has a mild intermittent spinning sensation. It is not triggered by any change in position.  Patient is also noted generalized vision deterioration over the past one year. She also has poor hearing.  Patient is also noted some memory difficulty over the last 12 months. She has also had intermittent muscle jerks in her right arm.  Patient mentioned strong family history for cerebral aneurysm in her mother, cousin, aunt.   REVIEW OF SYSTEMS: Full 14 system review  of systems performed and negative except swelling in legs shortness of breath snoring memory loss confusion numbness dizziness joint pain joint swelling rash birthmarks moles allergies.     ALLERGIES: Allergies  Allergen Reactions  . Cortisone Other (See Comments)    Caused pain and lasted 7 months or more--was told it crystalizes in some people and she may be one who is allergic to the medication  DOES NOT EVER WANT [place]  . Hydrocodone-Acetaminophen     REACTION: itches  . Rofecoxib     REACTION: asthma like symptoms    HOME MEDICATIONS: Outpatient Medications Prior to Visit  Medication Sig Dispense Refill  . acetaminophen-codeine 120-12 MG/5ML solution Take 10 mLs by mouth every 8 (eight) hours as needed for moderate pain. 473 mL 2  . albuterol (PROVENTIL HFA;VENTOLIN HFA) 108 (90 BASE) MCG/ACT inhaler Inhale 2 puffs into the lungs every 6 (six) hours as needed.    Marland Kitchen albuterol (PROVENTIL) (2.5 MG/3ML) 0.083% nebulizer solution Take 2.5 mg by nebulization every 6 (six) hours as needed.    . ALPRAZolam (XANAX) 0.5 MG tablet Take 1 tablet by mouth as needed. Reported on 11/05/2015    . cetirizine (ZYRTEC) 10 MG tablet Take 10 mg by mouth daily as needed for allergies.    . Cream Base (PCCA LIPODERM BASE) CREA Apply 1 application topically as needed.    . cyclobenzaprine (FLEXERIL) 10 MG tablet Take 10 mg by mouth daily as needed. Reported on 11/05/2015  2  . diclofenac sodium (  VOLTAREN) 1 % GEL Apply 2 g topically 4 (four) times daily. 5 Tube 2  . glucosamine-chondroitin 500-400 MG tablet Take 2 tablets by mouth daily.    Marland Kitchen guaiFENesin-codeine 100-10 MG/5ML syrup Take 10 mLs by mouth every 6 (six) hours as needed for cough. 473 mL 2  . ipratropium-albuterol (DUONEB) 0.5-2.5 (3) MG/3ML SOLN     . montelukast (SINGULAIR) 10 MG tablet Take 10 mg by mouth every evening.  5  . Multiple Vitamins-Minerals (MULTIVITAMINS THER. W/MINERALS) TABS Take 1 tablet by mouth daily.    .  naproxen (NAPROSYN) 500 MG tablet Take 1 tablet by mouth daily. Reported on 11/05/2015    . OVER THE COUNTER MEDICATION FLEX ALL OTC (Krill oil)  Daily    . QUASENSE 0.15-0.03 MG tablet Take 1 tablet by mouth daily.     Marland Kitchen Respiratory Therapy Supplies (AIRS DISPOSABLE NEBULIZER) KIT   3  . valsartan-hydrochlorothiazide (DIOVAN-HCT) 160-25 MG per tablet     . Vitamin D, Ergocalciferol, (DRISDOL) 50000 UNITS CAPS Once a week.    Marland Kitchen amLODipine (NORVASC) 5 MG tablet Take 5 mg by mouth daily.     . hydrochlorothiazide (HYDRODIURIL) 25 MG tablet Take 25 mg by mouth daily. Reported on 11/05/2015    . ketorolac (TORADOL) 10 MG tablet Take 1 tablet (10 mg total) by mouth every 6 (six) hours as needed. (Patient not taking: Reported on 02/19/2018) 20 tablet 0  . methocarbamol (ROBAXIN) 750 MG tablet Take 750 mg by mouth 4 (four) times daily. Reported on 11/05/2015    . traZODone (DESYREL) 50 MG tablet Take 1 tablet by mouth daily. Reported on 11/05/2015     No facility-administered medications prior to visit.     PAST MEDICAL HISTORY: Past Medical History:  Diagnosis Date  . Anxiety and depression   . Arthritis    knee pain-uses crutches at times in house  . Asthma    as child and occ  . Back pain   . Hx of migraines   . Hypertension   . Obesity   . PONV (postoperative nausea and vomiting)   . Sleep apnea    mild-no cpap needed    PAST SURGICAL HISTORY: Past Surgical History:  Procedure Laterality Date  . CARPAL TUNNEL RELEASE  03/09/2012   Procedure: CARPAL TUNNEL RELEASE;  Surgeon: Wynonia Sours, MD;  Location: [place];  Service: Orthopedics;  Laterality: Right;  . CESAREAN SECTION    . CHOLECYSTECTOMY    . HERNIA REPAIR      FAMILY HISTORY: Family History  Problem Relation Age of Onset  . Hypertension Mother   . Cancer Father        not sure of what kind  . Hypertension Maternal Aunt   . Hypertension Paternal Aunt   . Hypertension Maternal Grandmother     SOCIAL  HISTORY:  Social History   Socioeconomic History  . Marital status: Married    Spouse name: Jenny Reichmann  . Number of children: 1  . Years of education: BS  . Highest education level: Not on file  Occupational History    Employer: [employer]  . Financial resource strain: Not on file  . Food insecurity:    Worry: Not on file    Inability: Not on file  . Transportation needs:    Medical: Not on file    Non-medical: Not on file  Tobacco Use  . Smoking status: Never Smoker  . Smokeless tobacco: Never Used  Substance and Sexual Activity  . Alcohol use: Yes    Comment: occasionally; 2-3 weekly  . Drug use: No  . Sexual activity: Not on file  Lifestyle  . Physical activity:    Days per week: Not on file    Minutes per session: Not on file  . Stress: Not on file  Relationships  . Social connections:    Talks on phone: Not on file    Gets together: Not on file    Attends religious service: Not on file    Active member of club or organization: Not on file    Attends meetings of clubs or organizations: Not on file    Relationship status: Not on file  . Intimate partner violence:    Fear of current or ex partner: Not on file    Emotionally abused: Not on file    Physically abused: Not on file    Forced sexual activity: Not on file  Other Topics Concern  . Not on file  Social History Narrative   Patient lives at home with family. (husband and son),  Caffeine Use: 2-3 sodas weekly.  Works at [employer].     PHYSICAL EXAM  Vitals:   02/19/18 0959  BP: 115/71  Pulse: 65  Weight: 283 lb 6.4 oz (128.5 kg)  Height: _0  (1.6 m)    Not recorded      Body mass index is 50.2 kg/m.  GENERAL EXAM: Patient is in no distress; well developed, nourished and groomed; neck is supple.  CARDIOVASCULAR: Regular rate and rhythm, no murmurs, no carotid bruits  NEUROLOGIC: MENTAL STATUS: awake, alert, oriented to person, place and time, recent and remote memory intact,  normal attention and concentration, language fluent, comprehension intact, naming intact, fund of knowledge appropriate CRANIAL NERVE: no papilledema on fundoscopic exam, pupils equal and reactive to light, visual fields full to confrontation, extraocular muscles intact, no nystagmus, facial sensation and strength symmetric, hearing intact, palate elevates symmetrically, uvula midline, shoulder shrug symmetric, tongue midline. MOTOR: normal bulk and tone, full strength in the BUE, BLE SENSORY: normal and symmetric to light touch, temperature, vibration COORDINATION: finger-nose-finger, fine finger movements normal REFLEXES: deep tendon reflexes present and symmetric GAIT/STATION: CAUTIOUS GAIT    DIAGNOSTIC DATA (LABS, IMAGING, TESTING) - I reviewed patient records, labs, notes, testing and imaging myself where available.  Lab Results  Component Value Date   HGB 13.4 03/09/2012      Component Value Date/Time   NA 138 03/04/2012 1330   K 3.9 03/04/2012 1330   CL 102 03/04/2012 1330   CO2 27 03/04/2012 1330   GLUCOSE 91 03/04/2012 1330   BUN 13 03/04/2012 1330   CREATININE 0.85 03/04/2012 1330   CALCIUM 9.5 03/04/2012 1330   GFRNONAA 83 (L) 03/04/2012 1330   GFRAA >90 03/04/2012 1330   No results found for: CHOL No results found for: HGBA1C No results found for: VITAMINB12 No results found for: TSH    10/17/13 MRI brain - normal  10/17/13 MRA head - normal     ASSESSMENT AND PLAN  52 y.o. year old female here with tinnitus, headaches, memory loss. Also family history of cerebral aneurysm. Neurologic examination is unremarkable. MRI / MRA head in 2014 normal.   Dx:  1. Tinnitus of both ears   2. Tension headache   3. Migraine without aura and without status migrainosus, not intractable       PLAN:  TINNITUS / HEADACHES (idiopathic intracranial hypertension (pseudotumor cerebri) vs  other cause) - monitor symptoms - follow up ophthalmology exam - consider MRI  brain - consider LP (measure opening pressure)  MEMORY LOSS (due to sleep disorder, anxiety) - optimize nutrition and exercise  ANXIETY - improved; may consider psychology evaluation in future  SNORING / FATIGUE / OSA - follow up with [facility]  Return in about 6 months (around 08/22/2018).    Penni Bombard, MD 02/20/3605, 77:03 AM Certified in Neurology, Neurophysiology and Neuroimaging  [facility] [address] [place], [place] [address] [phone]

## 2018-06-16 ENCOUNTER — Ambulatory Visit: Payer: 59 | Admitting: Diagnostic Neuroimaging

## 2018-07-27 ENCOUNTER — Encounter (INDEPENDENT_AMBULATORY_CARE_PROVIDER_SITE_OTHER): Payer: Self-pay | Admitting: Orthopaedic Surgery

## 2018-07-27 ENCOUNTER — Ambulatory Visit (INDEPENDENT_AMBULATORY_CARE_PROVIDER_SITE_OTHER): Payer: 59

## 2018-07-27 ENCOUNTER — Ambulatory Visit (INDEPENDENT_AMBULATORY_CARE_PROVIDER_SITE_OTHER): Payer: 59 | Admitting: Orthopaedic Surgery

## 2018-07-27 DIAGNOSIS — M25561 Pain in right knee: Secondary | ICD-10-CM

## 2018-07-27 DIAGNOSIS — R399 Unspecified symptoms and signs involving the genitourinary system: Secondary | ICD-10-CM | POA: Diagnosis not present

## 2018-07-27 DIAGNOSIS — M1711 Unilateral primary osteoarthritis, right knee: Secondary | ICD-10-CM

## 2018-07-27 DIAGNOSIS — G8929 Other chronic pain: Secondary | ICD-10-CM | POA: Diagnosis not present

## 2018-07-27 DIAGNOSIS — M1712 Unilateral primary osteoarthritis, left knee: Secondary | ICD-10-CM | POA: Diagnosis not present

## 2018-07-27 DIAGNOSIS — Z6841 Body Mass Index (BMI) 40.0 and over, adult: Secondary | ICD-10-CM

## 2018-07-27 DIAGNOSIS — M25562 Pain in left knee: Secondary | ICD-10-CM | POA: Diagnosis not present

## 2018-07-27 MED ORDER — LIDOCAINE HCL 1 % IJ SOLN
2.0000 mL | INTRAMUSCULAR | Status: AC | PRN
  Administered 2018-07-27: 2 mL

## 2018-07-27 MED ORDER — BUPIVACAINE HCL 0.5 % IJ SOLN
2.0000 mL | INTRAMUSCULAR | Status: AC | PRN
  Administered 2018-07-27: 2 mL via INTRA_ARTICULAR

## 2018-07-27 NOTE — Progress Notes (Signed)
Office Visit Note   Patient: Alexis Cook           Date of Birth: 11/08/1965           MRN: [id number] Visit Date: 07/27/2018              Requested by: Maurice Small, MD [address], [place] [address] PCP: Maurice Small, MD   Assessment & Plan: Visit Diagnoses:  1. Chronic pain of both knees   2. Unilateral primary osteoarthritis, left knee   3. Unilateral primary osteoarthritis, right knee   4. Morbid obesity ([place])   5. Body mass index 50.0-59.9, adult (HCC)     Plan: Bilateral knee pain secondary to tricompartmental arthritis bilaterally.  Patient treated with intra-articular Toradol injections bilaterally today.  We will order viscosupplementation and have her return when this is available.  Ultimately, she will need knee replacements but is not a candidate at this time due to BMI greater than 40.  She will follow-up when the visco available.  Follow-Up Instructions: Return if symptoms worsen or fail to improve.   Orders:  Orders Placed This Encounter  Procedures  . XR KNEE 3 VIEW LEFT  . XR KNEE 3 VIEW RIGHT   No orders of the defined types were placed in this encounter.     Procedures: Large Joint Inj: bilateral knee on 07/27/2018 3:45 PM Indications: pain Details: 22 G needle  Arthrogram: No  Medications (Right): 2 mL lidocaine 1 %; 2 mL bupivacaine 0.5 % Medications (Left): 2 mL lidocaine 1 %; 2 mL bupivacaine 0.5 % Outcome: tolerated well, no immediate complications Patient was prepped and draped in the usual sterile fashion.       Clinical Data: No additional findings.   Subjective: Chief Complaint  Patient presents with  . Left Knee - Pain    R<L  . Right Knee - Pain    HPI 52 year old female with bilateral knee pain for 8 to 10 years.  Pain initially in the left knee followed by the right knee.  Today her left is more painful than the right and has been worsening since August.  She denies any specific recent  injury to account for the worsening pain.  Pain is worse with activity and bending her knee.  She reports feeling stiffness.  In the past she had received intra-articular Toradol injections which helped her pain quite a bit.  She also reports good relief with Visco supplementation the last of which was 2 years ago.  She feels that benefited her for nearly 2 years.  No relief with Aleve.  She denies any recent joint swelling, redness, or increased warmth.  No numbness or tingling.   Review of Systems See HPI  Objective: Vital Signs: There were no vitals taken for this visit.  Physical Exam  GEN: Awake, alert no acute distress Pulmonary: Breathing unlabored  Ortho Exam Bilateral knees: - Inspection: no gross deformity, swelling, erythema - Palpation: Tenderness over the medial lateral joint lines - ROM: Full knee extension.  Flexion limited by body habitus - Strength: 5/5 strength - Neuro/vasc: NV intact - Special Tests: No cruciate or collateral ligament instability.    Specialty Comments:  No specialty comments available.  Imaging: Xr Knee 3 View Left  Result Date: 07/27/2018 End-stage tricompartmental degenerative joint disease with mild varus deformity  Xr Knee 3 View Right  Result Date: 07/27/2018 End-stage tricompartmental degenerative joint disease with mild varus deformity    PMFS History: Patient  Active Problem List   Diagnosis Date Noted  . Other bilateral secondary osteoarthritis of knee 02/23/2015  . Spondylosis of lumbar region without myelopathy or radiculopathy 02/23/2015  . Abdominal pain, acute, right lower quadrant 11/04/2011  . HYPERTENSION 01/09/2010  . ALLERGIC RHINITIS 01/09/2010  . ASTHMA 01/09/2010  . SLEEP DISORDER 01/09/2010  . WHEEZING 01/09/2010  . COUGH 01/09/2010   Past Medical History:  Diagnosis Date  . Anxiety and depression   . Arthritis    knee pain-uses crutches at times in house  . Asthma    as child and occ  . Back pain     . Hx of migraines   . Hypertension   . Obesity   . PONV (postoperative nausea and vomiting)   . Sleep apnea    mild-no cpap needed    Family History  Problem Relation Age of Onset  . Hypertension Mother   . Cancer Father        not sure of what kind  . Hypertension Maternal Aunt   . Hypertension Paternal Aunt   . Hypertension Maternal Grandmother     Past Surgical History:  Procedure Laterality Date  . CARPAL TUNNEL RELEASE  03/09/2012   Procedure: CARPAL TUNNEL RELEASE;  Surgeon: Wynonia Sours, MD;  Location: [facility];  Service: Orthopedics;  Laterality: Right;  . CESAREAN SECTION    . CHOLECYSTECTOMY    . HERNIA REPAIR     Social History   Occupational History    Employer: [employer]  Tobacco Use  . Smoking status: Never Smoker  . Smokeless tobacco: Never Used  Substance and Sexual Activity  . Alcohol use: Yes    Comment: occasionally; 2-3 weekly  . Drug use: No  . Sexual activity: Not on file

## 2018-08-06 DIAGNOSIS — Z0000 Encounter for general adult medical examination without abnormal findings: Secondary | ICD-10-CM | POA: Diagnosis not present

## 2018-08-09 DIAGNOSIS — Z79899 Other long term (current) drug therapy: Secondary | ICD-10-CM | POA: Diagnosis not present

## 2018-08-09 DIAGNOSIS — I10 Essential (primary) hypertension: Secondary | ICD-10-CM | POA: Diagnosis not present

## 2018-08-11 ENCOUNTER — Other Ambulatory Visit: Payer: Self-pay | Admitting: Gastroenterology

## 2018-08-11 DIAGNOSIS — K921 Melena: Secondary | ICD-10-CM | POA: Diagnosis not present

## 2018-08-23 ENCOUNTER — Ambulatory Visit: Payer: 59 | Admitting: Diagnostic Neuroimaging

## 2018-09-15 ENCOUNTER — Other Ambulatory Visit: Payer: Self-pay | Admitting: Gastroenterology

## 2018-09-21 ENCOUNTER — Other Ambulatory Visit: Payer: Self-pay

## 2018-09-21 ENCOUNTER — Encounter (HOSPITAL_COMMUNITY): Payer: Self-pay | Admitting: *Deleted

## 2018-09-22 ENCOUNTER — Encounter (HOSPITAL_COMMUNITY)
Admission: RE | Disposition: A | Discharge status: Home / Self Care | Payer: Self-pay | Source: Ambulatory Visit | Attending: Internal Medicine

## 2018-09-22 ENCOUNTER — Ambulatory Visit (HOSPITAL_COMMUNITY): Payer: 59 | Admitting: Anesthesiology

## 2018-09-22 ENCOUNTER — Encounter (HOSPITAL_COMMUNITY): Payer: Self-pay | Admitting: *Deleted

## 2018-09-22 ENCOUNTER — Observation Stay (HOSPITAL_COMMUNITY)
Admission: RE | Admit: 2018-09-22 | Discharge: 2018-09-23 | Disposition: A | Discharge status: Home / Self Care | Payer: 59 | Source: Ambulatory Visit | Attending: Family Medicine | Admitting: Family Medicine

## 2018-09-22 DIAGNOSIS — K922 Gastrointestinal hemorrhage, unspecified: Secondary | ICD-10-CM | POA: Diagnosis not present

## 2018-09-22 DIAGNOSIS — J45902 Unspecified asthma with status asthmaticus: Secondary | ICD-10-CM | POA: Diagnosis not present

## 2018-09-22 DIAGNOSIS — K5730 Diverticulosis of large intestine without perforation or abscess without bleeding: Secondary | ICD-10-CM | POA: Diagnosis not present

## 2018-09-22 DIAGNOSIS — Y838 Other surgical procedures as the cause of abnormal reaction of the patient, or of later complication, without mention of misadventure at the time of the procedure: Secondary | ICD-10-CM | POA: Insufficient documentation

## 2018-09-22 DIAGNOSIS — D125 Benign neoplasm of sigmoid colon: Secondary | ICD-10-CM | POA: Diagnosis not present

## 2018-09-22 DIAGNOSIS — K91840 Postprocedural hemorrhage and hematoma of a digestive system organ or structure following a digestive system procedure: Secondary | ICD-10-CM | POA: Diagnosis not present

## 2018-09-22 DIAGNOSIS — Z79899 Other long term (current) drug therapy: Secondary | ICD-10-CM | POA: Insufficient documentation

## 2018-09-22 DIAGNOSIS — F329 Major depressive disorder, single episode, unspecified: Secondary | ICD-10-CM | POA: Diagnosis not present

## 2018-09-22 DIAGNOSIS — I10 Essential (primary) hypertension: Secondary | ICD-10-CM | POA: Insufficient documentation

## 2018-09-22 DIAGNOSIS — D128 Benign neoplasm of rectum: Secondary | ICD-10-CM | POA: Diagnosis not present

## 2018-09-22 DIAGNOSIS — K649 Unspecified hemorrhoids: Secondary | ICD-10-CM | POA: Diagnosis not present

## 2018-09-22 DIAGNOSIS — J45909 Unspecified asthma, uncomplicated: Secondary | ICD-10-CM | POA: Diagnosis not present

## 2018-09-22 DIAGNOSIS — G4730 Sleep apnea, unspecified: Secondary | ICD-10-CM | POA: Diagnosis not present

## 2018-09-22 DIAGNOSIS — Z8249 Family history of ischemic heart disease and other diseases of the circulatory system: Secondary | ICD-10-CM | POA: Diagnosis not present

## 2018-09-22 DIAGNOSIS — Z6841 Body Mass Index (BMI) 40.0 and over, adult: Secondary | ICD-10-CM | POA: Diagnosis not present

## 2018-09-22 DIAGNOSIS — M1990 Unspecified osteoarthritis, unspecified site: Secondary | ICD-10-CM | POA: Insufficient documentation

## 2018-09-22 DIAGNOSIS — D127 Benign neoplasm of rectosigmoid junction: Secondary | ICD-10-CM | POA: Insufficient documentation

## 2018-09-22 DIAGNOSIS — F419 Anxiety disorder, unspecified: Secondary | ICD-10-CM | POA: Insufficient documentation

## 2018-09-22 DIAGNOSIS — Z809 Family history of malignant neoplasm, unspecified: Secondary | ICD-10-CM | POA: Diagnosis not present

## 2018-09-22 DIAGNOSIS — G43909 Migraine, unspecified, not intractable, without status migrainosus: Secondary | ICD-10-CM | POA: Insufficient documentation

## 2018-09-22 DIAGNOSIS — Z885 Allergy status to narcotic agent status: Secondary | ICD-10-CM | POA: Diagnosis not present

## 2018-09-22 DIAGNOSIS — Z9049 Acquired absence of other specified parts of digestive tract: Secondary | ICD-10-CM | POA: Diagnosis not present

## 2018-09-22 DIAGNOSIS — K6289 Other specified diseases of anus and rectum: Secondary | ICD-10-CM | POA: Diagnosis not present

## 2018-09-22 HISTORY — PX: POLYPECTOMY: SHX5525

## 2018-09-22 HISTORY — PX: COLONOSCOPY WITH PROPOFOL: SHX5780

## 2018-09-22 HISTORY — PX: SUBMUCOSAL INJECTION: SHX5543

## 2018-09-22 LAB — TYPE AND SCREEN
ABO/RH(D): A POS
Antibody Screen: NEGATIVE

## 2018-09-22 LAB — CBC
HCT: 39 % (ref 36.0–46.0)
Hemoglobin: 12.5 g/dL (ref 12.0–15.0)
MCH: 29.7 pg (ref 26.0–34.0)
MCHC: 32.1 g/dL (ref 30.0–36.0)
MCV: 92.6 fL (ref 80.0–100.0)
Platelets: 283 10*3/uL (ref 150–400)
RBC: 4.21 MIL/uL (ref 3.87–5.11)
RDW: 13.4 % (ref 11.5–15.5)
WBC: 7.1 10*3/uL (ref 4.0–10.5)
nRBC: 0 % (ref 0.0–0.2)

## 2018-09-22 LAB — PREGNANCY, URINE: Preg Test, Ur: NEGATIVE

## 2018-09-22 LAB — ABO/RH: ABO/RH(D): A POS

## 2018-09-22 LAB — PROTIME-INR
INR: 1.03
Prothrombin Time: 13.4 seconds (ref 11.4–15.2)

## 2018-09-22 SURGERY — COLONOSCOPY WITH PROPOFOL
Anesthesia: Monitor Anesthesia Care

## 2018-09-22 MED ORDER — LEVONORGEST-ETH ESTRAD 91-DAY 0.15-0.03 MG PO TABS: 1.0000 | ORAL_TABLET | Freq: Every day | ORAL | Status: DC

## 2018-09-22 MED ORDER — HYDROCHLOROTHIAZIDE 25 MG PO TABS
25.0000 mg | ORAL_TABLET | Freq: Every day | ORAL | Status: DC
  Administered 2018-09-22: 25 mg via ORAL
  Filled 2018-09-22: qty 1

## 2018-09-22 MED ORDER — ACETAMINOPHEN 325 MG PO TABS: 650.0000 mg | ORAL_TABLET | Freq: Four times a day (QID) | ORAL | Status: DC | PRN

## 2018-09-22 MED ORDER — ALBUTEROL SULFATE (2.5 MG/3ML) 0.083% IN NEBU: 2.5000 mg | INHALATION_SOLUTION | Freq: Four times a day (QID) | RESPIRATORY_TRACT | Status: DC | PRN

## 2018-09-22 MED ORDER — ACETAMINOPHEN 650 MG RE SUPP: 650.0000 mg | Freq: Four times a day (QID) | RECTAL | Status: DC | PRN

## 2018-09-22 MED ORDER — LORATADINE 10 MG PO TABS
10.0000 mg | ORAL_TABLET | Freq: Every day | ORAL | Status: DC
  Filled 2018-09-22: qty 1

## 2018-09-22 MED ORDER — ADULT MULTIVITAMIN W/MINERALS CH
1.0000 | ORAL_TABLET | Freq: Every day | ORAL | Status: DC
  Administered 2018-09-22: 1 via ORAL
  Filled 2018-09-22: qty 1

## 2018-09-22 MED ORDER — VALSARTAN-HYDROCHLOROTHIAZIDE 160-25 MG PO TABS: 1.0000 | ORAL_TABLET | Freq: Every day | ORAL | Status: DC

## 2018-09-22 MED ORDER — SODIUM CHLORIDE 0.9% FLUSH
3.0000 mL | Freq: Two times a day (BID) | INTRAVENOUS | Status: DC
  Administered 2018-09-22: 3 mL via INTRAVENOUS

## 2018-09-22 MED ORDER — SODIUM CHLORIDE 0.9% FLUSH: 3.0000 mL | INTRAVENOUS | Status: DC | PRN

## 2018-09-22 MED ORDER — PROPOFOL 500 MG/50ML IV EMUL
INTRAVENOUS | Status: DC | PRN
  Administered 2018-09-22: 100 ug/kg/min via INTRAVENOUS

## 2018-09-22 MED ORDER — SODIUM CHLORIDE 0.9 % IV SOLN: INTRAVENOUS | Status: DC

## 2018-09-22 MED ORDER — LACTATED RINGERS IV SOLN
INTRAVENOUS | Status: DC
  Administered 2018-09-22: 09:00:00 via INTRAVENOUS

## 2018-09-22 MED ORDER — ONDANSETRON HCL 4 MG/2ML IJ SOLN: 4.0000 mg | Freq: Four times a day (QID) | INTRAMUSCULAR | Status: DC | PRN

## 2018-09-22 MED ORDER — VITAMIN C 500 MG PO TABS
500.0000 mg | ORAL_TABLET | Freq: Every day | ORAL | Status: DC
Start: 2018-09-22 — End: 2018-09-23
  Administered 2018-09-22: 500 mg via ORAL
  Filled 2018-09-22: qty 1

## 2018-09-22 MED ORDER — ONDANSETRON HCL 4 MG PO TABS: 4.0000 mg | ORAL_TABLET | Freq: Four times a day (QID) | ORAL | Status: DC | PRN

## 2018-09-22 MED ORDER — LIDOCAINE 2% (20 MG/ML) 5 ML SYRINGE
INTRAMUSCULAR | Status: DC | PRN
  Administered 2018-09-22: 100 mg via INTRAVENOUS

## 2018-09-22 MED ORDER — PROPOFOL 10 MG/ML IV BOLUS
INTRAVENOUS | Status: AC
  Filled 2018-09-22: qty 60

## 2018-09-22 MED ORDER — MENTHOL-CAMPHOR 11-11 % EX CREA: 1.0000 "application " | TOPICAL_CREAM | Freq: Every day | CUTANEOUS | Status: DC

## 2018-09-22 MED ORDER — ALBUTEROL SULFATE HFA 108 (90 BASE) MCG/ACT IN AERS: 2.0000 | INHALATION_SPRAY | Freq: Four times a day (QID) | RESPIRATORY_TRACT | Status: DC | PRN

## 2018-09-22 MED ORDER — SODIUM CHLORIDE 0.9 % IV SOLN: 250.0000 mL | INTRAVENOUS | Status: DC | PRN

## 2018-09-22 MED ORDER — PROPOFOL 10 MG/ML IV BOLUS
INTRAVENOUS | Status: AC
  Filled 2018-09-22: qty 20

## 2018-09-22 MED ORDER — EPINEPHRINE PF 1 MG/10ML IJ SOSY
PREFILLED_SYRINGE | INTRAMUSCULAR | Status: AC
  Filled 2018-09-22: qty 10

## 2018-09-22 MED ORDER — THERA M PLUS PO TABS: 1.0000 | ORAL_TABLET | Freq: Every day | ORAL | Status: DC

## 2018-09-22 MED ORDER — IRBESARTAN 150 MG PO TABS
150.0000 mg | ORAL_TABLET | Freq: Every day | ORAL | Status: DC
  Administered 2018-09-22: 150 mg via ORAL
  Filled 2018-09-22: qty 1

## 2018-09-22 MED ORDER — PROMETHAZINE-CODEINE 6.25-10 MG/5ML PO SYRP: 5.0000 mL | ORAL_SOLUTION | Freq: Four times a day (QID) | ORAL | Status: DC | PRN

## 2018-09-22 MED ORDER — PROPOFOL 10 MG/ML IV BOLUS
INTRAVENOUS | Status: DC | PRN
  Administered 2018-09-22 (×7): 20 mg via INTRAVENOUS

## 2018-09-22 MED ORDER — MUSCLE RUB 10-15 % EX CREA
TOPICAL_CREAM | Freq: Every day | CUTANEOUS | Status: DC
  Filled 2018-09-22: qty 85

## 2018-09-22 MED ORDER — SODIUM CHLORIDE (PF) 0.9 % IJ SOLN
PREFILLED_SYRINGE | INTRAMUSCULAR | Status: DC | PRN
  Administered 2018-09-22: 3 mL

## 2018-09-22 SURGICAL SUPPLY — 21 items

## 2018-09-22 NOTE — Discharge Instructions (Signed)

## 2018-09-22 NOTE — Anesthesia Procedure Notes (Addendum)
Date/Time: 09/22/2018 8:57 AM Performed by: Sharlette Dense, CRNA Oxygen Delivery Method: Simple face mask

## 2018-09-22 NOTE — Transfer of Care (Signed)
Immediate Anesthesia Transfer of Care Note  Patient: Alexis Cook  Procedure(s) Performed: COLONOSCOPY WITH PROPOFOL (N/A )  Patient Location: Endoscopy Unit  Anesthesia Type:MAC  Level of Consciousness: awake, alert  and oriented  Airway & Oxygen Therapy: Patient Spontanous Breathing  Post-op Assessment: Report given to RN and Post -op Vital signs reviewed and stable  Post vital signs: Reviewed and stable  Last Vitals:  Vitals Value Taken Time  BP    Temp    Pulse 90 09/22/2018  9:39 AM  Resp 31 09/22/2018  9:39 AM  SpO2 100 % 09/22/2018  9:39 AM  Vitals shown include unvalidated device data.  Last Pain:  Vitals:   09/22/18 0735  TempSrc: Oral  PainSc: 0-No pain         Complications: No apparent anesthesia complications

## 2018-09-22 NOTE — Anesthesia Preprocedure Evaluation (Signed)
Anesthesia Evaluation  Patient identified by MRN, date of birth, ID band Patient awake    Reviewed: Allergy & Precautions, NPO status , Patient's Chart, lab work & pertinent test results  History of Anesthesia Complications (+) PONV  Airway Mallampati: II  TM Distance: >3 FB Neck ROM: Full    Dental no notable dental hx.    Pulmonary asthma ,    Pulmonary exam normal breath sounds clear to auscultation       Cardiovascular hypertension, Pt. on medications Normal cardiovascular exam Rhythm:Regular Rate:Normal     Neuro/Psych negative neurological ROS  negative psych ROS   GI/Hepatic negative GI ROS, Neg liver ROS,   Endo/Other  Morbid obesity  Renal/GU negative Renal ROS  negative genitourinary   Musculoskeletal negative musculoskeletal ROS (+)   Abdominal   Peds negative pediatric ROS (+)  Hematology negative hematology ROS (+)   Anesthesia Other Findings   Reproductive/Obstetrics negative OB ROS                             Anesthesia Physical Anesthesia Plan  ASA: III  Anesthesia Plan: MAC   Post-op Pain Management:    Induction: Intravenous  PONV Risk Score and Plan: 4 or greater and Treatment may vary due to age or medical condition  Airway Management Planned: Simple Face Mask  Additional Equipment:   Intra-op Plan:   Post-operative Plan:   Informed Consent: I have reviewed the patients History and Physical, chart, labs and discussed the procedure including the risks, benefits and alternatives for the proposed anesthesia with the patient or authorized representative who has indicated his/her understanding and acceptance.   Dental advisory given  Plan Discussed with:   Anesthesia Plan Comments:         Anesthesia Quick Evaluation

## 2018-09-22 NOTE — H&P (Signed)
History and Physical    Alexis Cook NWG:[id number] DOB: 1966-06-25 DOA: 09/22/2018  PCP: Maurice Small, MD   Patient coming from: Endoscopy   Chief Complaint: Rectal bleeding.   HPI: Alexis Cook is a 52 y.o. female with medical history significant of osteoarthritis, obesity, hypertension, and asthma.  She presented to the outpatient endoscopy center for a routine colonoscopy.  Underwent a polyp removed, during procedure she had just been bleeding who required clipping and local hemostasis.  The bleeding was more than usual for a polypectomy, no improving or worsening factors, transitory, no associate abdominal pain, improved after local intervention.  Currently postprocedure she denies any abdominal pain, nausea, vomiting, melena, hematochezia or hematemesis.  Review of Systems:  1. General: No fevers, no chills, no weight gain or weight loss 2. ENT: No runny nose or sore throat, no hearing disturbances 3. Pulmonary: No dyspnea, cough, wheezing, or hemoptysis 4. Cardiovascular: No angina, claudication, lower extremity edema, pnd or orthopnea 5. Gastrointestinal: No nausea or vomiting, no diarrhea or constipation 6. Hematology: No easy bruisability or frequent infections 7. Urology: No dysuria, hematuria or increased urinary frequency 8. Dermatology: No rashes. 9. Neurology: No seizures or paresthesias 10. Musculoskeletal: No joint pain or deformities  Past Medical History:  Diagnosis Date  . Anxiety and depression   . Arthritis    knee pain-uses crutches at times in house  . Asthma    as child and occ  . Back pain   . Hx of migraines   . Hypertension   . Obesity   . PONV (postoperative nausea and vomiting)   . Sleep apnea    mild-no cpap needed    Past Surgical History:  Procedure Laterality Date  . CARPAL TUNNEL RELEASE  03/09/2012   Procedure: CARPAL TUNNEL RELEASE;  Surgeon: Wynonia Sours, MD;  Location: [facility];  Service: Orthopedics;   Laterality: Right;  . CESAREAN SECTION    . CHOLECYSTECTOMY    . HERNIA REPAIR       reports that she has never smoked. She has never used smokeless tobacco. She reports that she drinks alcohol. She reports that she does not use drugs.  Allergies  Allergen Reactions  . Cortisone Other (See Comments)    Caused pain and lasted 7 months or more--was told it crystalizes in some people and she may be one who is allergic to the medication  DOES NOT EVER WANT [place]  . Hydrocodone-Acetaminophen     REACTION: itches  . Rofecoxib     REACTION: asthma like symptoms    Family History  Problem Relation Age of Onset  . Hypertension Mother   . Cancer Father        not sure of what kind  . Hypertension Maternal Aunt   . Hypertension Paternal Aunt   . Hypertension Maternal Grandmother      Prior to Admission medications   Medication Sig Start Date End Date Taking? Authorizing Provider  albuterol (PROVENTIL) (2.5 MG/3ML) 0.083% nebulizer solution Take 2.5 mg by nebulization every 6 (six) hours as needed for wheezing or shortness of breath.    Yes [provider]  Boswellia Serrata (BOSWELLIA PO) Take 1-3 capsules by mouth daily.   Yes [provider]  DEVILS CLAW PO Take 1-3 capsules by mouth daily.   Yes [provider]  levonorgestrel-ethinyl estradiol (SEASONALE,INTROVALE,JOLESSA) 0.15-0.03 MG tablet Take 1 tablet by mouth daily. 10/20/11  Yes [provider]  Menthol-Camphor (TIGER BALM ARTHRITIS RUB) 11-11 %  CREA Apply 1 application topically daily.   Yes [provider]  Multiple Vitamins-Minerals (MULTIVITAMINS THER. W/MINERALS) TABS Take 1 tablet by mouth daily.   Yes [provider]  promethazine-codeine (PHENERGAN WITH CODEINE) 6.25-10 MG/5ML syrup Take 5 mLs by mouth every 6 (six) hours as needed for cough.   Yes [provider]  valsartan-hydrochlorothiazide (DIOVAN-HCT) 160-25 MG per tablet Take 1 tablet  by mouth daily.  11/04/14  Yes [provider]  vitamin C (ASCORBIC ACID) 500 MG tablet Take 500 mg by mouth daily.   Yes [provider]  albuterol (PROVENTIL HFA;VENTOLIN HFA) 108 (90 BASE) MCG/ACT inhaler Inhale 2 puffs into the lungs every 6 (six) hours as needed for wheezing or shortness of breath.     [provider]  ALPRAZolam Duanne Moron) 0.5 MG tablet Take 1 tablet by mouth as needed. Reported on 11/05/2015 10/17/13   [provider]  cetirizine (ZYRTEC) 10 MG tablet Take 10 mg by mouth daily as needed for allergies.    [provider]  Respiratory Therapy Supplies (AIRS DISPOSABLE NEBULIZER) KIT  09/21/14   [provider]    Physical Exam: Vitals:   09/22/18 0939 09/22/18 0940 09/22/18 0950 09/22/18 1000  BP: (!) 164/89 (!) 164/89 (!) 158/69 (!) 155/80  Pulse: 88 90 91 85  Resp:  (!) 31 (!) 28 (!) 22  Temp: 98.1 F (36.7 C)     TempSrc: Oral     SpO2: 100% 100% 98% 99%  Weight:      Height:        Vitals:   09/22/18 0939 09/22/18 0940 09/22/18 0950 09/22/18 1000  BP: (!) 164/89 (!) 164/89 (!) 158/69 (!) 155/80  Pulse: 88 90 91 85  Resp:  (!) 31 (!) 28 (!) 22  Temp: 98.1 F (36.7 C)     TempSrc: Oral     SpO2: 100% 100% 98% 99%  Weight:      Height:       General: Not in pain or dyspnea  Neurology: Awake and alert, non focal Head and Neck. Head normocephalic. Neck supple with no adenopathy or thyromegaly.   E ENT: no pallor, no icterus, oral mucosa moist Cardiovascular: No JVD. S1-S2 present, rhythmic, no gallops, rubs, or murmurs. No lower extremity edema. Pulmonary: vesicular breath sounds bilaterally, adequate air movement, no wheezing, rhonchi or rales. Gastrointestinal. Abdomen protuberant, no organomegaly, non tender, no rebound or guarding Skin. No rashes Musculoskeletal: no joint deformities    Labs on Admission: I have personally reviewed following labs and imaging studies  CBC: Recent Labs  Lab  09/22/18 0930  WBC 7.1  HGB 12.5  HCT 39.0  MCV 92.6  PLT 169   Basic Metabolic Panel: No results for input(s): NA, K, CL, CO2, GLUCOSE, BUN, CREATININE, CALCIUM, MG, PHOS in the last 168 hours. GFR: CrCl cannot be calculated (Patient's most recent lab result is older than the maximum 21 days allowed.). Liver Function Tests: No results for input(s): AST, ALT, ALKPHOS, BILITOT, PROT, ALBUMIN in the last 168 hours. No results for input(s): LIPASE, AMYLASE in the last 168 hours. No results for input(s): AMMONIA in the last 168 hours. Coagulation Profile: No results for input(s): INR, PROTIME in the last 168 hours. Cardiac Enzymes: No results for input(s): CKTOTAL, CKMB, CKMBINDEX, TROPONINI in the last 168 hours. BNP (last 3 results) No results for input(s): PROBNP in the last 8760 hours. HbA1C: No results for input(s): HGBA1C in the last 72 hours. CBG: No results for  input(s): GLUCAP in the last 168 hours. Lipid Profile: No results for input(s): CHOL, HDL, LDLCALC, TRIG, CHOLHDL, LDLDIRECT in the last 72 hours. Thyroid Function Tests: No results for input(s): TSH, T4TOTAL, FREET4, T3FREE, THYROIDAB in the last 72 hours. Anemia Panel: No results for input(s): VITAMINB12, FOLATE, FERRITIN, TIBC, IRON, RETICCTPCT in the last 72 hours. Urine analysis: No results found for: COLORURINE, APPEARANCEUR, LABSPEC, PHURINE, GLUCOSEU, HGBUR, BILIRUBINUR, KETONESUR, PROTEINUR, UROBILINOGEN, NITRITE, LEUKOCYTESUR  Radiological Exams on Admission: No results found.  EKG: Independently reviewed.   Assessment/Plan Active Problems:   Lower GI bleed  52 year old female with multiple medical problems who presented for a routine colonoscopy, complicated by an unusual bleeding after polypectomy.  The bleeding improved with local measures.  On the initial physical examination her blood pressure is 155/80, heart rate is 85, respiratory rate is 22, oxygen saturation is 99%, moist mucous membranes,  lungs clear to auscultation bilaterally, heart S1-S2 present rhythmic, abdomen protuberant, nontender, no lower extremity edema.  White count 7.1, hemoglobin 12.5, hematocrit 39.0, platelets 283.  Patient will be placed on observation to monitor hemoglobin/hematocrit.  1.  Post polypectomy bleeding.  No clinical signs of active bleeding, will continue hemoglobin and hematocrit check in the morning.  Currently no indication for PRBC transfusion.  Will check PT/INR for work-up completion.  2.  Hypertension.  Will resume antihypertensive regimen with valsartan/hydrochlorothiazide.  3.  Asthma.  No signs of acute exacerbation, continue as needed albuterol.  4.  Morbid obesity.  Calculated BMI 50.2, will need outpatient follow-up.  DVT prophylaxis: scd  Code Status: full  Family Communication: I spoke with patient's husband at the bedside and all questions were addressed.   Disposition Plan: Plan to discharge patient in the morning if hemoglobin/hematocrit stable. Consults called: GI   Admission status:  Observation.     Alexis Agena Gerome Apley MD [facility] Pager [phone]  If 7PM-7AM, please contact night-coverage [email or web address] Password [email or web address]  09/22/2018, 10:14 AM

## 2018-09-22 NOTE — H&P (Signed)
Patient interval history reviewed.  Patient examined again.  There has been no change from documented H/P dated  (scanned into chart from our office) except as documented above.  Assessment:  1.  Blood in stool.  Plan:  1.  Colonoscopy. 2.  Risks (bleeding, infection, bowel perforation that could require surgery, sedation-related changes in cardiopulmonary systems), benefits (identification and possible treatment of source of symptoms, exclusion of certain causes of symptoms), and alternatives (watchful waiting, radiographic imaging studies, empiric medical treatment) of colonoscopy were explained to patient/family in detail and patient wishes to proceed.

## 2018-09-22 NOTE — Anesthesia Postprocedure Evaluation (Signed)
Anesthesia Post Note  Patient: Alexis Cook  Procedure(s) Performed: COLONOSCOPY WITH PROPOFOL (N/A )     Patient location during evaluation: Endoscopy Anesthesia Type: MAC Level of consciousness: awake and alert Pain management: pain level controlled Vital Signs Assessment: post-procedure vital signs reviewed and stable Respiratory status: spontaneous breathing, nonlabored ventilation, respiratory function stable and patient connected to nasal cannula oxygen Cardiovascular status: stable and blood pressure returned to baseline Postop Assessment: no apparent nausea or vomiting Anesthetic complications: no    Last Vitals:  Vitals:   09/22/18 1030 09/22/18 1102  BP: (!) 171/93 (!) 148/89  Pulse: 66 72  Resp: (!) 26 17  Temp:  36.8 C  SpO2: 100% 100%    Last Pain:  Vitals:   09/22/18 1102  TempSrc: Oral  PainSc:                  Montez Hageman

## 2018-09-23 ENCOUNTER — Encounter (HOSPITAL_COMMUNITY): Payer: Self-pay | Admitting: Gastroenterology

## 2018-09-23 DIAGNOSIS — K922 Gastrointestinal hemorrhage, unspecified: Secondary | ICD-10-CM | POA: Diagnosis not present

## 2018-09-23 DIAGNOSIS — K91840 Postprocedural hemorrhage and hematoma of a digestive system organ or structure following a digestive system procedure: Secondary | ICD-10-CM | POA: Diagnosis not present

## 2018-09-23 LAB — BASIC METABOLIC PANEL
Anion gap: 7 (ref 5–15)
BUN: 7 mg/dL (ref 6–20)
CO2: 28 mmol/L (ref 22–32)
Calcium: 9.1 mg/dL (ref 8.9–10.3)
Chloride: 106 mmol/L (ref 98–111)
Creatinine, Ser: 0.77 mg/dL (ref 0.44–1.00)
GFR calc Af Amer: >60 mL/min (ref >60)
GFR calc non Af Amer: >60 mL/min (ref >60)
Glucose, Bld: 103 mg/dL — ABNORMAL HIGH (ref 70–99)
Potassium: 3.2 mmol/L — ABNORMAL LOW (ref 3.5–5.1)
Sodium: 141 mmol/L (ref 135–145)

## 2018-09-23 LAB — CBC
HCT: 37.5 % (ref 36.0–46.0)
Hemoglobin: 12 g/dL (ref 12.0–15.0)
MCH: 29.3 pg (ref 26.0–34.0)
MCHC: 32 g/dL (ref 30.0–36.0)
MCV: 91.5 fL (ref 80.0–100.0)
Platelets: 289 10*3/uL (ref 150–400)
RBC: 4.1 MIL/uL (ref 3.87–5.11)
RDW: 13.2 % (ref 11.5–15.5)
WBC: 7.8 10*3/uL (ref 4.0–10.5)
nRBC: 0 % (ref 0.0–0.2)

## 2018-09-23 LAB — HIV ANTIBODY (ROUTINE TESTING W REFLEX): HIV Screen 4th Generation wRfx: NONREACTIVE

## 2018-09-23 NOTE — Progress Notes (Signed)
I have reviewed and concur with this student's documentation.   

## 2018-09-23 NOTE — Op Note (Signed)
[facility] Patient Name: Alexis Cook Procedure Date: 09/22/2018 MRN: [id number] Attending MD: Arta Silence , MD Date of Birth: 05/29/1966 CSN: [id number] Age: 52 Admit Type: Outpatient Procedure:                Colonoscopy Indications:              This is the patient's first colonoscopy,                            Hematochezia, Family history of ulcerative colitis                            in a first-degree relative Providers:                Arta Silence, MD, Cleda Daub, RN, Georgian Mcclory Dalton, Technician Referring MD:             Jonathon Jordan, MD Medicines:                Monitored Anesthesia Care Complications:            Post-polypectomy bleeding (described in report). Estimated Blood Loss:     Estimated blood loss: 25 mL requiring treatment                            with placement of hemostatic clip(s). Procedure:                Pre-Anesthesia Assessment:                           - Prior to the procedure, a History and Physical                            was performed, and patient medications and                            allergies were reviewed. The patient's tolerance of                            previous anesthesia was also reviewed. The risks                            and benefits of the procedure and the sedation                            options and risks were discussed with the patient.                            All questions were answered, and informed consent                            was obtained. Prior Anticoagulants: The patient has  taken no previous anticoagulant or antiplatelet                            agents. ASA Grade Assessment: III - A patient with                            severe systemic disease. After reviewing the risks                            and benefits, the patient was deemed in                            satisfactory condition to undergo the procedure.                      After obtaining informed consent, the colonoscope                            was passed under direct vision. Throughout the                            procedure, the patient's blood pressure, pulse, and                            oxygen saturations were monitored continuously. The                            [id number]-HQ190L ([id number] Olympus adult colonoscope was                            introduced through the anus and advanced to the the                            cecum, identified by appendiceal orifice and                            ileocecal valve. The colonoscopy was performed                            without difficulty. The patient tolerated the                            procedure well. The quality of the bowel                            preparation was adequate. Scope In: 9:06:44 AM Scope Out: 9:32:28 AM Scope Withdrawal Time: 0 hours 22 minutes 23 seconds  Total Procedure Duration: 0 hours 25 minutes 44 seconds  Findings:      Hemorrhoids were found on perianal exam.      A localized area of moderately nodular and plaque covered mucosa was       found at the anus.      No additional abnormalities were found on retroflexion.      A few medium-mouthed diverticula were found in the sigmoid colon.  A 12 mm polyp was found in the recto-sigmoid colon. The polyp was       semi-pedunculated. The polyp was removed with a hot snare. Resection and       retrieval were complete. Pulsatile bleeding noted after polypectomy.       After 3 hemostatic clips were placed to stop bleeding, area was       subsequently successfully injected with 3 mL of a 1:10,000 solution of       epinephrine for hemostasis. Bleeding had stopped at the end of the       procedure.      Colon otherwise normal; no other polyps, masses, vascular ectasias, or       inflammatory changes were seen. Impression:               - Hemorrhoids found on perianal exam.                           - Nodular and  plaque covered mucosa at the anus.                           - Diverticulosis in the sigmoid colon.                           - One 12 mm polyp at the recto-sigmoid colon,                            removed with a hot snare. Resected and retrieved.                            Post-polypectomy bleeding, hemostasis achieved with                            clips + EPI injection. Moderate Sedation:      Not Applicable - Patient had care per Anesthesia. Recommendation:           - Patient has a contact number available for                            emergencies. The signs and symptoms of potential                            delayed complications were discussed with the                            patient. Return to normal activities tomorrow.                            Written discharge instructions were provided to the                            patient.                           - Admit the patient to hospital ward for  observation. Blood type/cross has been sent. I have                            spoken to Dr. Cathlean Sauer [facility]) who has graciously                            accepted patient.                           - Full liquid diet today.                           - No aspirin, ibuprofen, naproxen, or other                            non-steroidal anti-inflammatory drugs for 7 days                            after polyp removal.                           - Await pathology results.                           [facility] will follow. If no bleeding tomorrow AM,                            and no significant drop in Hgb, would plan on ok to                            discharge patient tomorrow morning from GI                            perspective. Procedure Code(s):        --- Professional ---                           [phone], Colonoscopy, flexible; with removal of                            tumor(s), polyp(s), or other lesion(s) by snare                             technique Diagnosis Code(s):        --- Professional ---                           K62.89, Other specified diseases of anus and rectum                           K64.9, Unspecified hemorrhoids                           D12.7, Benign neoplasm of rectosigmoid junction  K92.1, Melena (includes Hematochezia)                           Z83.79, Family history of other diseases of the                            digestive system                           K57.30, Diverticulosis of large intestine without                            perforation or abscess without bleeding CPT copyright 2018 American [employer] Association. All rights reserved. The codes documented in this report are preliminary and upon coder review may  be revised to meet current compliance requirements. Arta Silence, MD 09/22/2018 9:55:44 AM This report has been signed electronically. Number of Addenda: 0

## 2018-09-23 NOTE — Discharge Summary (Signed)
Physician Discharge Summary  Alexis Cook NLZ:[id number] DOB: March 30, 1966 DOA: 09/22/2018  PCP: Maurice Small, MD  Admit date: 09/22/2018 Discharge date: 09/23/2018  Admitted From: Endoscopy suite  Disposition:  Home   Recommendations for Outpatient Follow-up:  1. Follow up with PCP in 1-2 weeks 2. Please obtain CBC in 1-2 weeks   Home Health: None  Equipment/Devices: None  Discharge Condition: Fair  CODE STATUS: FULL Diet recommendation: Regular  Brief/Interim Summary: Alexis Cook is a 52 y.o. F with OA, obesity, HTN and asthma who presented to the outpatient endoscopy center for routine colonoscopy.  She underwent routine polypectomy during the procedure, after which pulsatile blood flow was noted, requiring several clips and epinephrine injection.  Due to the heavy nature of bleeding intra-procedure, GI recommended observation overnight.     PRINCIPAL HOSPITAL DIAGNOSIS: Post-polypectomy bleeding    Discharge Diagnoses:  Post-polypectomy bleeding The patient had several small BM overnight, one with a small amount of clotted blood.  She had no hematochezia or other clinical blood loss.  Her hemoglobin in the morning was unchanged from the previous night.          Discharge Instructions  Discharge Instructions    Diet general   Complete by:  As directed    Discharge instructions   Complete by:  As directed    You may return to normal activities. Resume normal diet. No aspirin, ibuprofen, naproxen, or other non-steroidal anti-inflammatory drugs for 7 days after polyp removal. Call [facility] for pathology results and follow up questions.   Increase activity slowly   Complete by:  As directed      Allergies as of 09/23/2018      Reactions   Cortisone Other (See Comments)   Caused pain and lasted 7 months or more--was told it crystalizes in some people and she may be one who is allergic to the medication  DOES NOT EVER WANT THIS MEDICATION AGAIN   Hydrocodone  Itching   Rofecoxib    REACTION: asthma like symptoms      Medication List    TAKE these medications   AIRS DISPOSABLE NEBULIZER Kit   albuterol (2.5 MG/3ML) 0.083% nebulizer solution Commonly known as:  PROVENTIL Take 2.5 mg by nebulization every 6 (six) hours as needed for wheezing or shortness of breath.   albuterol 108 (90 Base) MCG/ACT inhaler Commonly known as:  PROVENTIL HFA;VENTOLIN HFA Inhale 2 puffs into the lungs every 6 (six) hours as needed for wheezing or shortness of breath.   ALPRAZolam 0.5 MG tablet Commonly known as:  XANAX Take 1 tablet by mouth as needed. Reported on 11/05/2015   BOSWELLIA PO Take 1-3 capsules by mouth daily.   cetirizine 10 MG tablet Commonly known as:  ZYRTEC Take 10 mg by mouth daily as needed for allergies.   DEVILS CLAW PO Take 1-3 capsules by mouth daily.   levonorgestrel-ethinyl estradiol 0.15-0.03 MG tablet Commonly known as:  SEASONALE,INTROVALE,JOLESSA Take 1 tablet by mouth daily.   multivitamins ther. w/minerals Tabs tablet Take 1 tablet by mouth daily.   promethazine-codeine 6.25-10 MG/5ML syrup Commonly known as:  PHENERGAN with CODEINE Take 5 mLs by mouth every 6 (six) hours as needed for cough.   TIGER BALM ARTHRITIS RUB 11-11 % Crea Generic drug:  Menthol-Camphor Apply 1 application topically daily.   valsartan-hydrochlorothiazide 160-25 MG tablet Commonly known as:  DIOVAN-HCT Take 1 tablet by mouth daily.   vitamin C 500 MG tablet Commonly known as:  ASCORBIC ACID Take 500 mg by  mouth daily.       Allergies  Allergen Reactions  . Cortisone Other (See Comments)    Caused pain and lasted 7 months or more--was told it crystalizes in some people and she may be one who is allergic to the medication  DOES NOT EVER WANT [name]  . Hydrocodone Itching  . Rofecoxib     REACTION: asthma like symptoms    Consultations:  None   Procedures/Studies:  No results  found.    Subjective: Feels well.  Small clotted blood in stool overnight once, other BM normal.  No cramping, diarrhea, dizziness.  Discharge Exam: Vitals:   09/22/18 2106 09/23/18 0557  BP: (!) 150/94 (!) 116/52  Pulse: 72 70  Resp: 18 18  Temp: 99.5 F (37.5 C) 98.7 F (37.1 C)  SpO2: 99% 100%   Vitals:   09/22/18 1030 09/22/18 1102 09/22/18 2106 09/23/18 0557  BP: (!) 171/93 (!) 148/89 (!) 150/94 (!) 116/52  Pulse: 66 72 72 70  Resp: (!) '26 17 18 18  '$ Temp:  98.2 F (36.8 C) 99.5 F (37.5 C) 98.7 F (37.1 C)  TempSrc:  Oral Oral Oral  SpO2: 100% 100% 99% 100%  Weight:      Height:        General: Pt is alert, awake, not in acute distress Cardiovascular: RRR, nl S1-S2, no murmurs appreciated.   No LE edema.   Respiratory: Normal respiratory rate and rhythm.  CTAB without rales or wheezes. Abdominal: Abdomen soft and non-tender.  No distension or HSM.   Neuro/Psych: Strength symmetric in upper and lower extremities.  Judgment and insight appear normal.   The results of significant diagnostics from this hospitalization (including imaging, microbiology, ancillary and laboratory) are listed below for reference.     Microbiology: No results found for this or any previous visit (from the past 240 hour(s)).   Labs: BNP (last 3 results) No results for input(s): BNP in the last 8760 hours. Basic Metabolic Panel: Recent Labs  Lab 09/23/18 0427  NA 141  K 3.2*  CL 106  CO2 28  GLUCOSE 103*  BUN 7  CREATININE 0.77  CALCIUM 9.1   Liver Function Tests: No results for input(s): AST, ALT, ALKPHOS, BILITOT, PROT, ALBUMIN in the last 168 hours. No results for input(s): LIPASE, AMYLASE in the last 168 hours. No results for input(s): AMMONIA in the last 168 hours. CBC: Recent Labs  Lab 09/22/18 0930 09/23/18 0427  WBC 7.1 7.8  HGB 12.5 12.0  HCT 39.0 37.5  MCV 92.6 91.5  PLT 283 289   Cardiac Enzymes: No results for input(s): CKTOTAL, CKMB, CKMBINDEX,  TROPONINI in the last 168 hours. BNP: Invalid input(s): POCBNP CBG: No results for input(s): GLUCAP in the last 168 hours. D-Dimer No results for input(s): DDIMER in the last 72 hours. Hgb A1c No results for input(s): HGBA1C in the last 72 hours. Lipid Profile No results for input(s): CHOL, HDL, LDLCALC, TRIG, CHOLHDL, LDLDIRECT in the last 72 hours. Thyroid function studies No results for input(s): TSH, T4TOTAL, T3FREE, THYROIDAB in the last 72 hours.  Invalid input(s): FREET3 Anemia work up No results for input(s): VITAMINB12, FOLATE, FERRITIN, TIBC, IRON, RETICCTPCT in the last 72 hours. Urinalysis No results found for: COLORURINE, APPEARANCEUR, LABSPEC, [place], GLUCOSEU, HGBUR, BILIRUBINUR, KETONESUR, PROTEINUR, UROBILINOGEN, NITRITE, LEUKOCYTESUR Sepsis Labs Invalid input(s): PROCALCITONIN,  WBC,  LACTICIDVEN Microbiology No results found for this or any previous visit (from the past 240 hour(s)).   Time coordinating discharge: 25  minutes       SIGNED:   Edwin Dada, MD  [facility] 09/23/2018, 9:07 AM

## 2018-09-28 DIAGNOSIS — H35342 Macular cyst, hole, or pseudohole, left eye: Secondary | ICD-10-CM | POA: Diagnosis not present

## 2018-09-28 DIAGNOSIS — H43822 Vitreomacular adhesion, left eye: Secondary | ICD-10-CM | POA: Diagnosis not present

## 2018-10-01 DIAGNOSIS — Z79899 Other long term (current) drug therapy: Secondary | ICD-10-CM | POA: Diagnosis not present

## 2018-10-01 DIAGNOSIS — R3989 Other symptoms and signs involving the genitourinary system: Secondary | ICD-10-CM | POA: Diagnosis not present

## 2018-10-01 DIAGNOSIS — K91840 Postprocedural hemorrhage and hematoma of a digestive system organ or structure following a digestive system procedure: Secondary | ICD-10-CM | POA: Diagnosis not present

## 2018-10-05 ENCOUNTER — Other Ambulatory Visit: Payer: Self-pay | Admitting: Family Medicine

## 2018-10-05 DIAGNOSIS — Z1231 Encounter for screening mammogram for malignant neoplasm of breast: Secondary | ICD-10-CM

## 2018-11-15 ENCOUNTER — Ambulatory Visit
Admission: RE | Admit: 2018-11-15 | Discharge: 2018-11-15 | Disposition: A | Discharge status: Home / Self Care | Payer: 59 | Source: Ambulatory Visit | Attending: Family Medicine | Admitting: Family Medicine

## 2018-11-15 DIAGNOSIS — Z1231 Encounter for screening mammogram for malignant neoplasm of breast: Secondary | ICD-10-CM | POA: Diagnosis not present

## 2018-11-17 DIAGNOSIS — R399 Unspecified symptoms and signs involving the genitourinary system: Secondary | ICD-10-CM | POA: Diagnosis not present

## 2018-11-17 DIAGNOSIS — R6889 Other general symptoms and signs: Secondary | ICD-10-CM | POA: Diagnosis not present

## 2018-12-03 DIAGNOSIS — H43813 Vitreous degeneration, bilateral: Secondary | ICD-10-CM | POA: Diagnosis not present

## 2018-12-03 DIAGNOSIS — H43822 Vitreomacular adhesion, left eye: Secondary | ICD-10-CM | POA: Diagnosis not present

## 2019-02-10 DIAGNOSIS — M7989 Other specified soft tissue disorders: Secondary | ICD-10-CM | POA: Diagnosis not present

## 2019-02-10 DIAGNOSIS — L309 Dermatitis, unspecified: Secondary | ICD-10-CM | POA: Diagnosis not present

## 2019-02-10 DIAGNOSIS — I10 Essential (primary) hypertension: Secondary | ICD-10-CM | POA: Diagnosis not present

## 2019-02-14 IMAGING — MG DIGITAL SCREENING BILATERAL MAMMOGRAM WITH CAD
4 series · 4 of 4 positions shown · non-contrast
Comparison: Previous exam(s).

CLINICAL DATA: Screening.

EXAM:
DIGITAL SCREENING BILATERAL MAMMOGRAM WITH CAD

[R MLO]
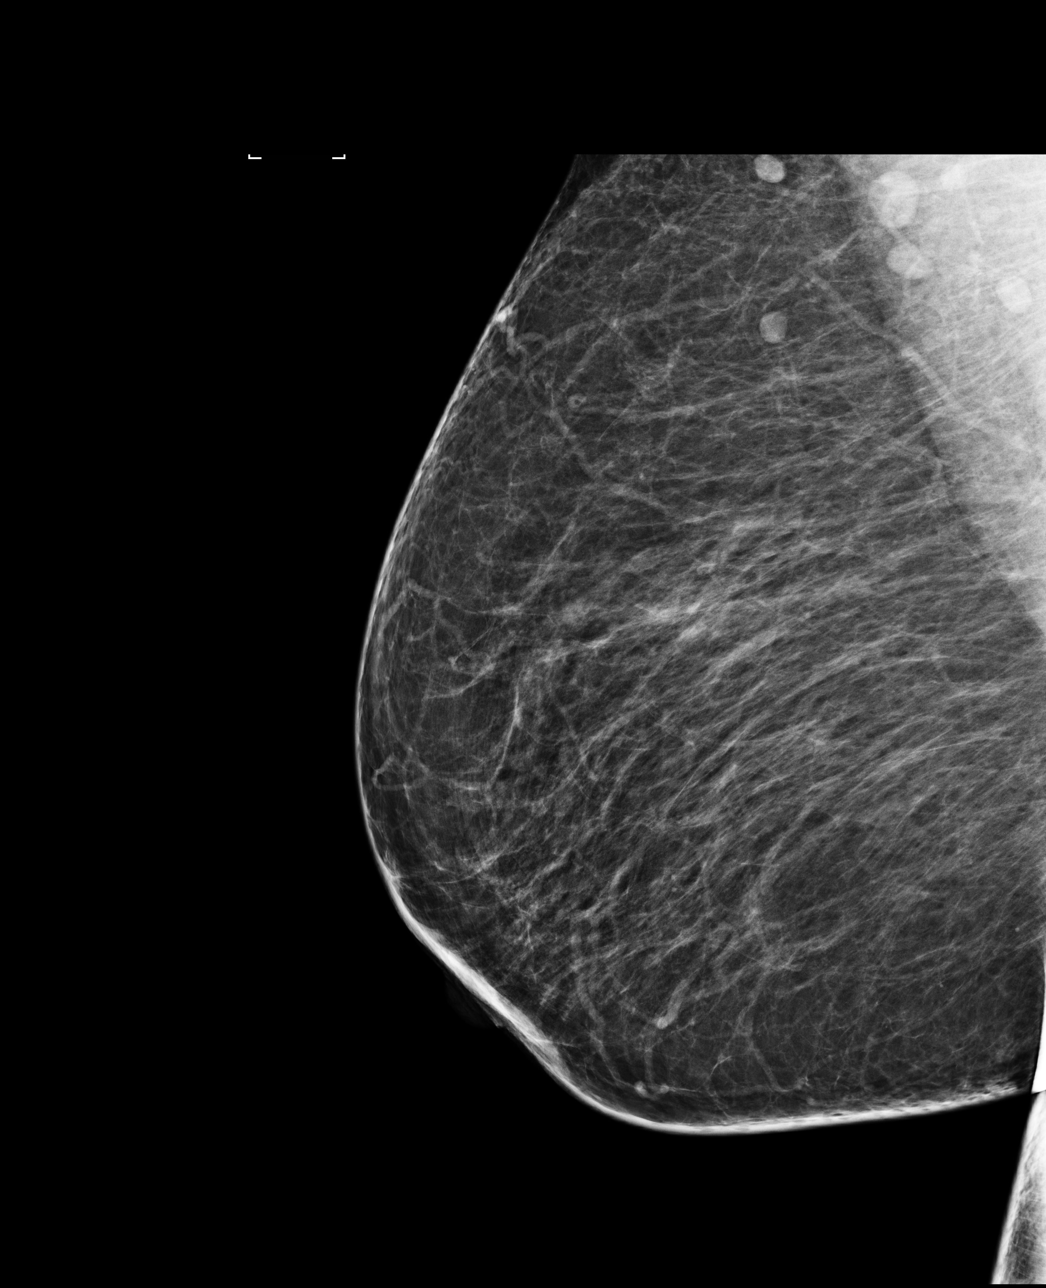

[L CC]
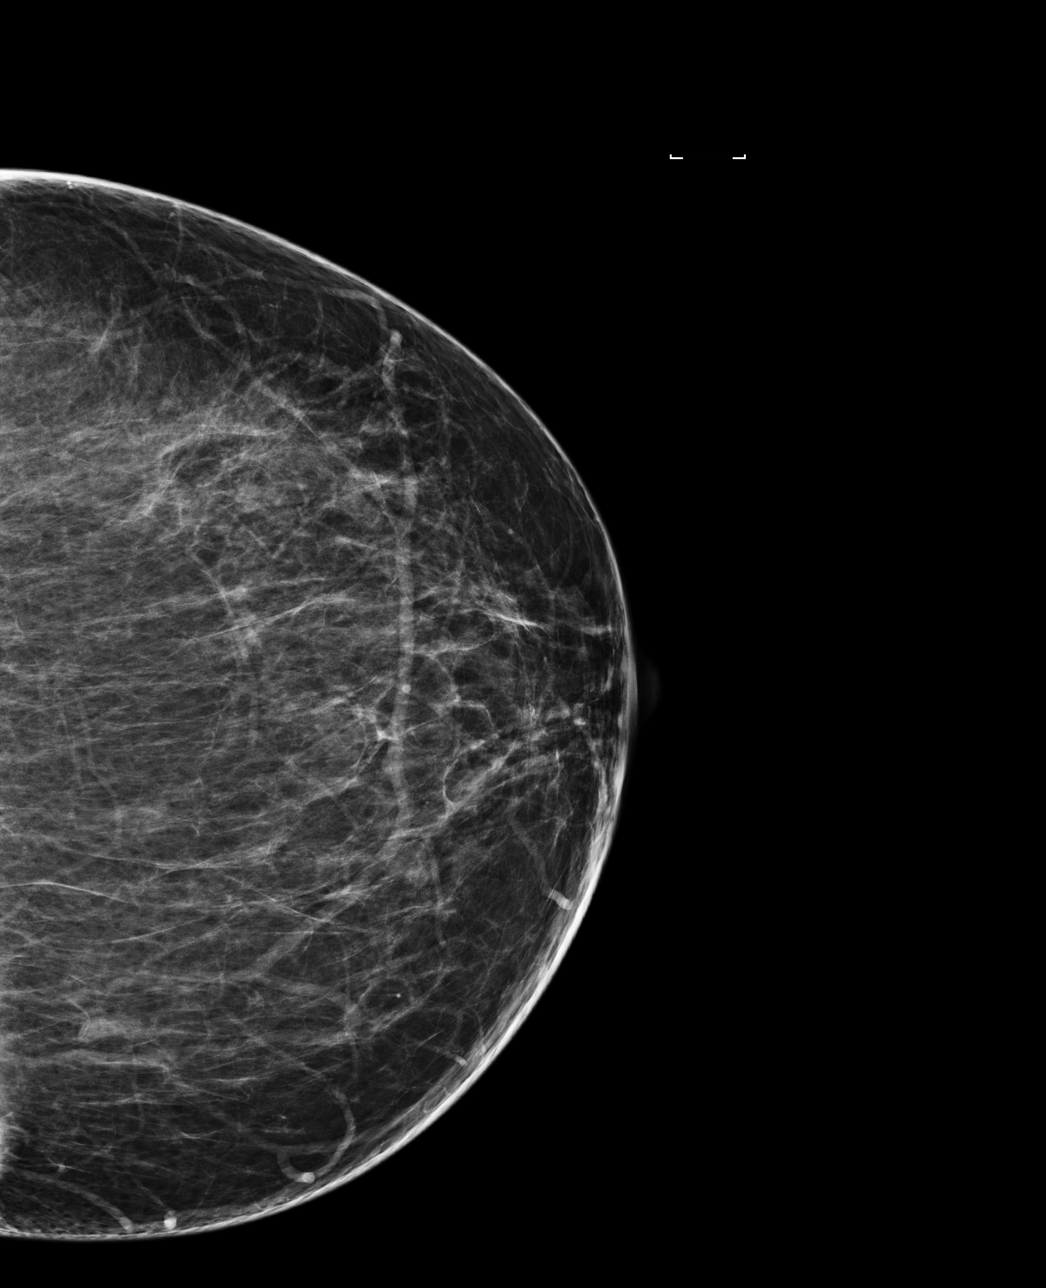

[L MLO]
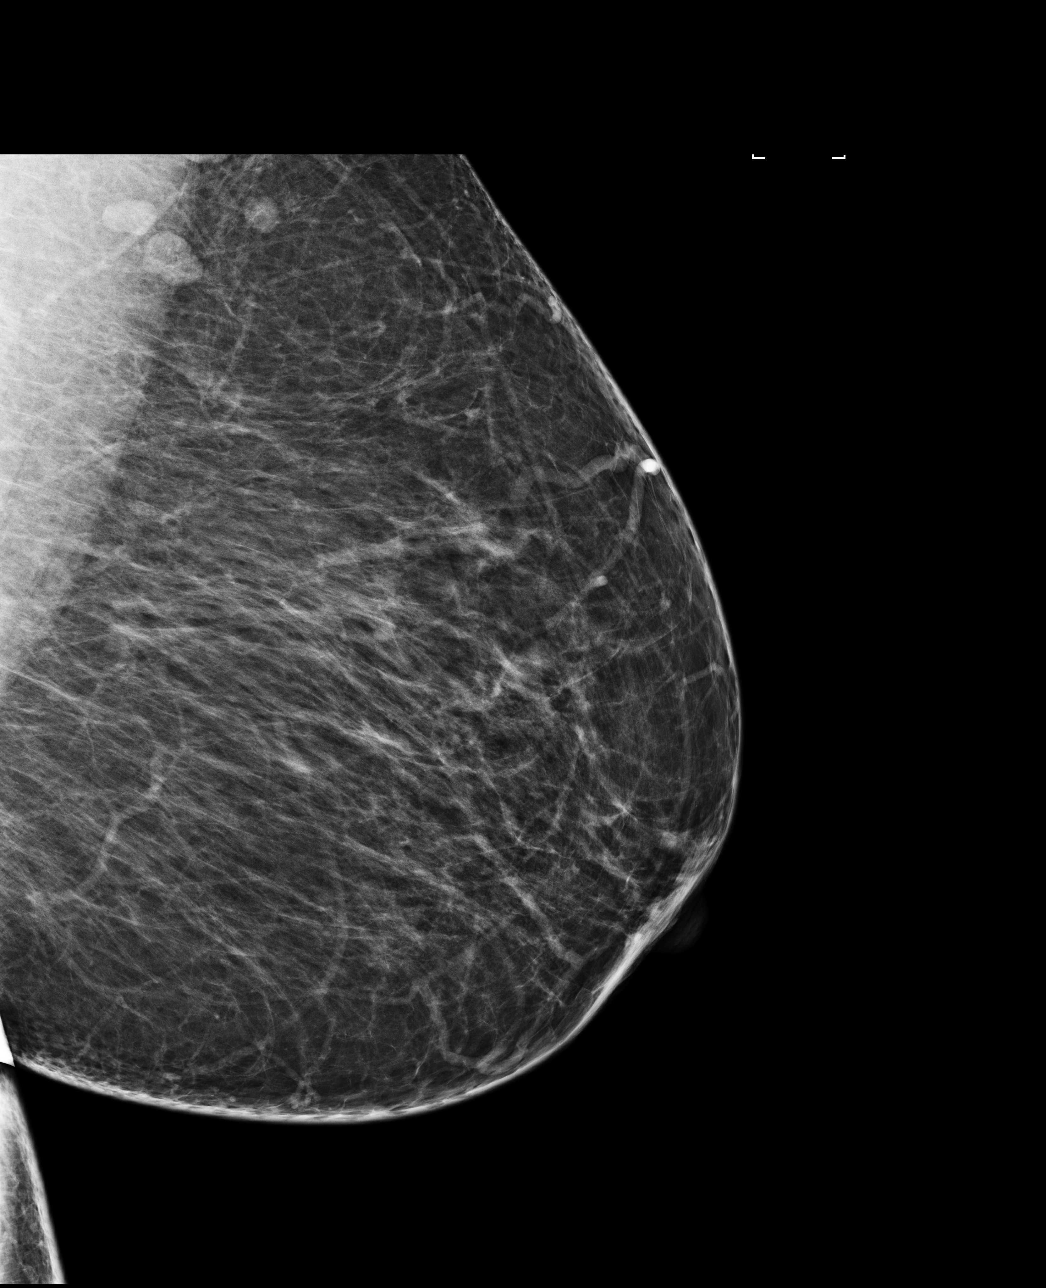

[R CC]
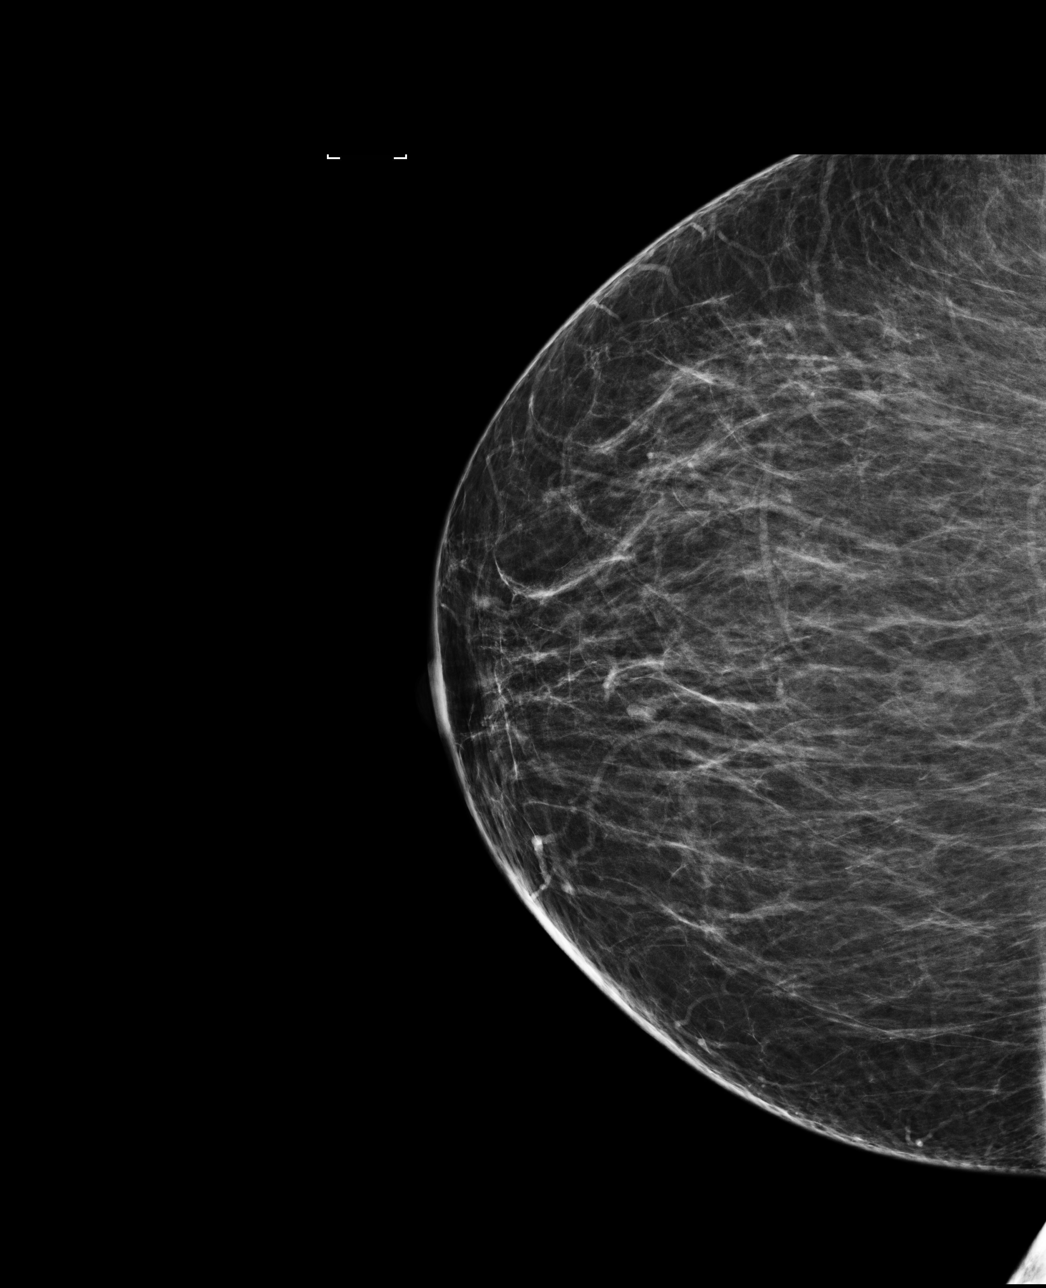

[4 of 4 positions shown; findings below may reference images not displayed]

ACR Breast Density Category b: There are scattered areas of
fibroglandular density.
FINDINGS: There are no findings suspicious for malignancy. Images were
processed with CAD.
IMPRESSION: No mammographic evidence of malignancy. A result letter of this
screening mammogram will be mailed directly to the patient.

RECOMMENDATION:
Screening mammogram in one year. (Code:AS-G-LCT)

BI-RADS CATEGORY  1: Negative.

## 2019-09-30 ENCOUNTER — Other Ambulatory Visit (HOSPITAL_COMMUNITY)
Admission: RE | Admit: 2019-09-30 | Discharge: 2019-09-30 | Disposition: A | Discharge status: Home / Self Care | Payer: 59 | Source: Ambulatory Visit | Attending: Family Medicine | Admitting: Family Medicine

## 2019-09-30 ENCOUNTER — Other Ambulatory Visit: Payer: Self-pay | Admitting: Family Medicine

## 2019-09-30 DIAGNOSIS — Z01411 Encounter for gynecological examination (general) (routine) with abnormal findings: Secondary | ICD-10-CM | POA: Diagnosis not present

## 2019-10-04 LAB — CYTOLOGY - PAP
Comment: NEGATIVE
Diagnosis: NEGATIVE
High risk HPV: NEGATIVE

## 2019-11-18 ENCOUNTER — Other Ambulatory Visit: Payer: Self-pay | Admitting: Family Medicine

## 2019-11-18 DIAGNOSIS — Z1231 Encounter for screening mammogram for malignant neoplasm of breast: Secondary | ICD-10-CM

## 2019-12-27 ENCOUNTER — Other Ambulatory Visit: Payer: Self-pay | Admitting: Family Medicine

## 2019-12-27 DIAGNOSIS — N644 Mastodynia: Secondary | ICD-10-CM

## 2020-01-11 ENCOUNTER — Other Ambulatory Visit: Payer: Self-pay

## 2020-01-11 ENCOUNTER — Ambulatory Visit: Payer: 59

## 2020-01-11 ENCOUNTER — Ambulatory Visit
Admission: RE | Admit: 2020-01-11 | Discharge: 2020-01-11 | Disposition: A | Discharge status: Home / Self Care | Payer: 59 | Source: Ambulatory Visit | Attending: Family Medicine | Admitting: Family Medicine

## 2020-01-11 DIAGNOSIS — N644 Mastodynia: Secondary | ICD-10-CM

## 2020-01-16 ENCOUNTER — Ambulatory Visit: Payer: 59 | Attending: Internal Medicine

## 2020-01-16 DIAGNOSIS — Z23 Encounter for immunization: Secondary | ICD-10-CM

## 2020-01-16 NOTE — Progress Notes (Signed)
° °  Covid-19 Vaccination Clinic  Name:  Alexis Cook    MRN: [id number] DOB: 06-22-1966  01/16/2020  Alexis Cook was observed post Covid-19 immunization for 15 minutes without incident. She was provided with Vaccine Information Sheet and instruction to access the V-Safe system.   [name] was instructed to call 911 with any severe reactions post vaccine:  Difficulty breathing   Swelling of face and throat   A fast heartbeat   A bad rash all over body   Dizziness and weakness   Immunizations Administered    Name Date Dose VIS Date Route   Pfizer COVID-19 Vaccine 01/16/2020  1:06 PM 0.3 mL 09/30/2019 Intramuscular   Manufacturer: [place]   Lot: [id number]   [place]: [id number]

## 2020-02-05 ENCOUNTER — Ambulatory Visit: Payer: Self-pay

## 2020-02-05 ENCOUNTER — Telehealth: Payer: Self-pay

## 2020-02-05 NOTE — Telephone Encounter (Signed)
See triage note regarding dizziness.

## 2020-02-05 NOTE — Telephone Encounter (Signed)
Pt c/o persistent intermittent  dizzy spells since 1 day after covid vaccine. Pt stated for 11 to 12 days she also had headache, but stated that no further headaches. Pt stated that she had an episode sitting at her desk where she felt very hot and felt like the room was spinning.  Pt stated that she is having 1-2 episodes per day but not as intense and feels they are improving.  Pt stated she had issues with HTN before and has similar sx. Previously her sx resolved after resuming her BP meds and increased hydration. Pt denies chest pain, jaw pain, left arm pain or nausea. Pt stated episodes are intermittent and last a few minutes. Pt denies double or blurred vision, H/A, Weakness or numbness of the face arms or legs.  Pt stated that she spoke to the nurse at work and was reassured. She stated the nurse told her she may have the headache /dizziness for up to 10 days. Advised pt to call her PCP to make an appt. Care advice given and pt verbalized understanding. Pt had vaccine 01/16/20. Unable to route note.  Reason for Disposition . [1] MILD dizziness (e.g., walking normally) AND [2] has NOT been evaluated by physician for this  (Exception: dizziness caused by heat exposure, sudden standing, or poor fluid intake)  Answer Assessment - Initial Assessment Questions 1. DESCRIPTION: "Describe your dizziness."     nausea 2. LIGHTHEADED: "Do you feel lightheaded?" (e.g., somewhat faint, woozy, weak upon standing)     yes 3. VERTIGO: "Do you feel like either you or the room is spinning or tilting?" (i.e. vertigo)     yes 4. SEVERITY: "How bad is it?"  "Do you feel like you are going to faint?" "Can you stand and walk?"   - MILD - walking normally   - MODERATE - interferes with normal activities (e.g., work, school)    - SEVERE - unable to stand, requires support to walk, feels like passing out now.      Mild- no-yes 5. ONSET:  "When did the dizziness begin?"     01/17/20 6. AGGRAVATING FACTORS: "Does  anything make it worse?" (e.g., standing, change in head position)     no 7. HEART RATE: "Can you tell me your heart rate?" "How many beats in 15 seconds?"  (Note: not all patients can do this)      76 per minute 8. CAUSE: "What do you think is causing the dizziness?"    covid vaccination 01/16/20 9. RECURRENT SYMPTOM: "Have you had dizziness before?" If so, ask: "When was the last time?" "What happened that time?"     3 years ago- HTN- began taking taking pills and rehydrated 10. OTHER SYMPTOMS: "Do you have any other symptoms?" (e.g., fever, chest pain, vomiting, diarrhea, bleeding)       no 11. PREGNANCY: "Is there any chance you are pregnant?" "When was your last menstrual period?"       n/a  Protocols used: DIZZINESS [facility]

## 2020-02-05 NOTE — Telephone Encounter (Addendum)
Pt is having dizzy spells after her first vaccine. Pt would like to know if she should still get her 2nd vaccine . Pt also having hip pain this occurred after the first vaccine.  Please advice . Pt is sch for 2nd vaccine on 4/21

## 2020-02-06 ENCOUNTER — Telehealth: Payer: Self-pay

## 2020-02-06 NOTE — Telephone Encounter (Signed)
Called Dr Jason Nest office at [place] [facility] [place] and spoke with Kenney Houseman. Informed Tonya of pt triage call from 02/05/20. Pt name,DOB and informed her that triage note is in chart.  I was unable to route to Dr Jason Nest office.

## 2020-02-08 ENCOUNTER — Ambulatory Visit: Payer: 59 | Attending: Internal Medicine

## 2020-02-08 DIAGNOSIS — Z23 Encounter for immunization: Secondary | ICD-10-CM

## 2020-02-08 NOTE — Progress Notes (Signed)
   Covid-19 Vaccination Clinic  Name:  Alexis Cook    MRN: [id number] DOB: 02-Feb-1966  02/08/2020  Alexis Cook was observed post Covid-19 immunization for 15 minutes without incident. She was provided with Vaccine Information Sheet and instruction to access the V-Safe system.   Alexis Cook was instructed to call 911 with any severe reactions post vaccine: Marland Kitchen Difficulty breathing  . Swelling of face and throat  . A fast heartbeat  . A bad rash all over body  . Dizziness and weakness   Immunizations Administered    Name Date Dose VIS Date Route   Pfizer COVID-19 Vaccine 02/08/2020  1:06 PM 0.3 mL 12/14/2018 Intramuscular   Manufacturer: [place]   Lot: [id number]   [place]: [id number]

## 2021-03-05 ENCOUNTER — Other Ambulatory Visit: Payer: Self-pay | Admitting: Family Medicine

## 2021-03-05 DIAGNOSIS — Z1231 Encounter for screening mammogram for malignant neoplasm of breast: Secondary | ICD-10-CM

## 2021-03-21 ENCOUNTER — Ambulatory Visit: Payer: Self-pay

## 2021-04-29 NOTE — Progress Notes (Signed)
Date:  04/30/2021   ID:  Alexis Cook, DOB 08-04-1966, MRN [id number]  PCP:  Maurice Small, MD  Cardiologist:  Rex Kras, DO, [facility] (established care 04/30/2021)  REASON FOR CONSULT: Palpitations, hypertension  REQUESTING PHYSICIAN:  Maurice Small, MD [name],  [place] [address]  Chief Complaint  Patient presents with   Hypertension   Palpitations   New Patient (Initial Visit)    Referred by Jonathon Jordan, MD    HPI  Alexis Cook is a 55 y.o. female who presents to the office with a chief complaint of " palpitations." Patient's past medical history and cardiovascular risk factors include: Obstructive sleep apnea, asthma, anxiety, benign essential hypertension, obesity due to excess calories, GERD.  Patient is referred to the office for evaluation and management of palpitations.  Palpitations: Patient has had palpitations in the past but recently they have been more frequent.  They occur on a daily basis lasting for few hours at a time, no improving or worsening factors, no associated lightheadedness, dizziness, near-syncope or syncope.  Patient states that she consumes coffee and sodas in a cyclical pattern but has not been doing so over the last couple weeks.   No use of new over-the-counter medications, herbal supplements, weight loss supplements, energy drinks, stimulant medications, illicit drug use.  No known thyroid disease or history of anemia.  Patient has history of obstructive sleep apnea though mild in severity diagnosed approximately 10 years ago and currently is not on CPAP.  FUNCTIONAL STATUS: No structured exercise program or daily routine.   ALLERGIES: Allergies  Allergen Reactions   Cortisone Other (See Comments)    Caused pain and lasted 7 months or more--was told it crystalizes in some people and she may be one who is allergic to the medication  DOES NOT EVER WANT THIS MEDICATION AGAIN   Hydrocodone Itching    Rofecoxib     REACTION: asthma like symptoms    MEDICATION LIST PRIOR TO VISIT: Current Meds  Medication Sig   albuterol (PROVENTIL HFA;VENTOLIN HFA) 108 (90 BASE) MCG/ACT inhaler Inhale 2 puffs into the lungs every 6 (six) hours as needed for wheezing or shortness of breath.    albuterol (PROVENTIL) (2.5 MG/3ML) 0.083% nebulizer solution Take 2.5 mg by nebulization every 6 (six) hours as needed for wheezing or shortness of breath.    ALPRAZolam (XANAX) 0.5 MG tablet Take 1 tablet by mouth as needed. Reported on 11/05/2015   cetirizine (ZYRTEC) 10 MG tablet Take 10 mg by mouth daily as needed for allergies.   diclofenac Sodium (VOLTAREN) 1 % GEL Apply topically.   doxycycline (VIBRA-TABS) 100 MG tablet Take 100 mg by mouth 2 (two) times daily.   famotidine (PEPCID) 40 MG tablet Take 40 mg by mouth at bedtime.   HYDROcodone-acetaminophen (NORCO/VICODIN) 5-325 MG tablet Take 1 tablet by mouth 2 (two) times daily as needed.   levonorgestrel-ethinyl estradiol (SEASONALE,INTROVALE,JOLESSA) 0.15-0.03 MG tablet Take 1 tablet by mouth daily.   Menthol-Camphor 11-11 % CREA Apply 1 application topically daily.   Multiple Vitamins-Minerals (MULTIVITAMINS THER. W/MINERALS) TABS Take 1 tablet by mouth daily.   promethazine-codeine (PHENERGAN WITH CODEINE) 6.25-10 MG/5ML syrup Take 5 mLs by mouth every 6 (six) hours as needed for cough.   Respiratory Therapy Supplies (AIRS DISPOSABLE NEBULIZER) KIT    valsartan-hydrochlorothiazide (DIOVAN-HCT) 160-25 MG per tablet Take 1 tablet by mouth daily.    vitamin C (ASCORBIC ACID) 500 MG tablet Take 500 mg by mouth daily.  PAST MEDICAL HISTORY: Past Medical History:  Diagnosis Date   Anxiety and depression    Arthritis    knee pain-uses crutches at times in house   Asthma    as child and occ   Back pain    Hx of migraines    Hypertension    Obesity    PONV (postoperative nausea and vomiting)    Sleep apnea    mild-no cpap needed    PAST SURGICAL  HISTORY: Past Surgical History:  Procedure Laterality Date   CARPAL TUNNEL RELEASE  03/09/2012   Procedure: CARPAL TUNNEL RELEASE;  Surgeon: Wynonia Sours, MD;  Location: [place];  Service: Orthopedics;  Laterality: Right;   CESAREAN SECTION     CHOLECYSTECTOMY     COLONOSCOPY WITH PROPOFOL N/A 09/22/2018   Procedure: COLONOSCOPY WITH PROPOFOL;  Surgeon: Arta Silence, MD;  Location: [facility];  Service: Endoscopy;  Laterality: N/A;   HERNIA REPAIR     POLYPECTOMY  09/22/2018   Procedure: POLYPECTOMY;  Surgeon: Arta Silence, MD;  Location: [facility];  Service: Endoscopy;;   SUBMUCOSAL INJECTION  09/22/2018   Procedure: SUBMUCOSAL INJECTION;  Surgeon: Arta Silence, MD;  Location: [facility];  Service: Endoscopy;;  Epinepherine    FAMILY HISTORY: The patient family history includes Cancer in her father; Hypertension in her maternal aunt, maternal grandmother, mother, and paternal aunt.  SOCIAL HISTORY:  The patient  reports that she has never smoked. She has never used smokeless tobacco. She reports current alcohol use. She reports that she does not use drugs.  REVIEW OF SYSTEMS: Review of Systems  Constitutional: Negative for chills and fever.  HENT:  Negative for hoarse voice and nosebleeds.   Eyes:  Negative for discharge, double vision and pain.  Cardiovascular:  Positive for palpitations. Negative for chest pain, claudication, dyspnea on exertion, leg swelling, near-syncope, orthopnea, paroxysmal nocturnal dyspnea and syncope.  Respiratory:  Negative for hemoptysis and shortness of breath.   Musculoskeletal:  Negative for muscle cramps and myalgias.  Gastrointestinal:  Negative for abdominal pain, constipation, diarrhea, hematemesis, hematochezia, melena, nausea and vomiting.  Neurological:  Negative for excessive daytime sleepiness and headaches.   PHYSICAL EXAM: Vitals with BMI 04/30/2021 09/23/2018 09/22/2018  Height $Remov'5\' 3"'aYYUgg$  - -  Weight 284 lbs 6 oz -  -  BMI 29.56 - -  Systolic 213 086 578  Diastolic 82 52 94  Pulse 93 70 72    CONSTITUTIONAL: Well-developed and well-nourished. No acute distress.  SKIN: Skin is warm and dry. No rash noted. No cyanosis. No pallor. No jaundice HEAD: Normocephalic and atraumatic.  EYES: No scleral icterus MOUTH/THROAT: Moist oral membranes.  NECK: No JVD present. No thyromegaly noted. No carotid bruits  LYMPHATIC: No visible cervical adenopathy.  CHEST Normal respiratory effort. No intercostal retractions  LUNGS: Clear to auscultation bilaterally.  No stridor. No wheezes. No rales.  CARDIOVASCULAR: Regular rate and rhythm, positive S1-S2, no murmurs rubs or gallops appreciated. ABDOMINAL: Obese, soft, nontender, nondistended, positive bowel sounds all 4 quadrants. No apparent ascites.  EXTREMITIES: No peripheral edema.  HEMATOLOGIC: No significant bruising NEUROLOGIC: Oriented to person, place, and time. Nonfocal. Normal muscle tone.  PSYCHIATRIC: Normal mood and affect. Normal behavior. Cooperative  CARDIAC DATABASE: EKG: 04/30/2021: Normal sinus rhythm, 75 bpm, normal axis, without underlying ischemia or injury pattern.   Echocardiogram: 03/27/2020: [facility] cardiology): LVEF 75%, mild LVH, trivial TR, mild left atrial enlargement.   Stress Testing: No results found for this or any previous visit from the  past 1095 days.   Heart Catheterization: None   LABORATORY DATA: CBC Latest Ref Rng & Units 09/23/2018 09/22/2018 03/09/2012  WBC 4.0 - 10.5 K/uL 7.8 7.1 -  Hemoglobin 12.0 - 15.0 g/dL 12.0 12.5 13.4  Hematocrit 36.0 - 46.0 % 37.5 39.0 -  Platelets 150 - 400 K/uL 289 283 -    CMP Latest Ref Rng & Units 04/30/2021 09/23/2018 03/04/2012  Glucose 65 - 99 mg/dL 83 103(H) 91  BUN 6 - 24 mg/dL $Remove'13 7 13  'vSbXUqT$ Creatinine 0.57 - 1.00 mg/dL 0.95 0.77 0.85  Sodium 134 - 144 mmol/L 140 141 138  Potassium 3.5 - 5.2 mmol/L 4.6 3.2(L) 3.9  Chloride 96 - 106 mmol/L 102 106 102  CO2 20 - 29 mmol/L $RemoveB'22 28  27  'MdJnHKmW$ Calcium 8.7 - 10.2 mg/dL 11.1(H) 9.1 9.5  Total Protein 6.0 - 8.5 g/dL 7.6 - -  Total Bilirubin 0.0 - 1.2 mg/dL 0.3 - -  Alkaline Phos 44 - 121 IU/L 76 - -  AST 0 - 40 IU/L 67(H) - -  ALT 0 - 32 IU/L 142(H) - -    Lipid Panel  No results found for: CHOL, TRIG, HDL, CHOLHDL, VLDL, LDLCALC, LDLDIRECT, LABVLDL  No components found for: NTPROBNP No results for input(s): PROBNP in the last 8760 hours. Recent Labs    04/30/21 1611  TSH 1.330    BMP Recent Labs    04/30/21 1611  NA 140  K 4.6  CL 102  CO2 22  GLUCOSE 83  BUN 13  CREATININE 0.95  CALCIUM 11.1*    HEMOGLOBIN A1C No results found for: HGBA1C, MPG  External Labs: Collected: 07/04/2020 Creatinine 0.82 mg/dL. eGFR: >60 mL/min per 1.73 m Lipid profile: Total cholesterol 165, triglycerides 56, HDL 56, LDL 98, non HDL 109 Hemoglobin 13.8 g/dL, hematocrit 41.1% AST 17, ALT 18, alkaline phosphatase 55.  IMPRESSION:    ICD-10-CM   1. Palpitations  R00.2 EKG 12-Lead    LONG TERM MONITOR (3-14 DAYS)    TSH    CMP14+EGFR    2. Benign hypertension  I10 PCV ECHOCARDIOGRAM COMPLETE    3. Gastroesophageal reflux disease, unspecified whether esophagitis present  K21.9     4. Sleep apnea, unspecified type  G47.30        RECOMMENDATIONS: Alexis Cook is a 55 y.o. female whose past medical history and cardiac risk factors include: Obstructive sleep apnea, asthma, anxiety, benign essential hypertension, obesity due to excess calories, GERD.  Palpitations: No identifiable reversible causes. Labs from September 2021 independently reviewed and noted above. Check a CMP, and TSH. Echocardiogram will be ordered to evaluate for structural heart disease and left ventricular systolic function. 7-day extended Holter monitor to evaluate burden of underlying dysrhythmias.  Benign essential hypertension: Office blood pressure within acceptable range. Medications reconciled. Currently managed by primary care  provider.  She has labs done at her work on an annual basis which includes lipid profile. I have asked her to bring a copy in for reference.  Obesity, due to excess calories: Body mass index is 50.38 kg/m. I reviewed with the patient the importance of diet, regular physical activity/exercise, weight loss.   Patient is educated on increasing physical activity gradually as tolerated.  With the goal of moderate intensity exercise for 30 minutes a day 5 days a week.   FINAL MEDICATION LIST END OF ENCOUNTER: No orders of the defined types were placed in this encounter.   Medications Discontinued During This Encounter  Medication Reason  Boswellia Serrata (BOSWELLIA PO) Error   DEVILS CLAW PO Error     Current Outpatient Medications:    albuterol (PROVENTIL HFA;VENTOLIN HFA) 108 (90 BASE) MCG/ACT inhaler, Inhale 2 puffs into the lungs every 6 (six) hours as needed for wheezing or shortness of breath. , Disp: , Rfl:    albuterol (PROVENTIL) (2.5 MG/3ML) 0.083% nebulizer solution, Take 2.5 mg by nebulization every 6 (six) hours as needed for wheezing or shortness of breath. , Disp: , Rfl:    ALPRAZolam (XANAX) 0.5 MG tablet, Take 1 tablet by mouth as needed. Reported on 11/05/2015, Disp: , Rfl:    cetirizine (ZYRTEC) 10 MG tablet, Take 10 mg by mouth daily as needed for allergies., Disp: , Rfl:    diclofenac Sodium (VOLTAREN) 1 % GEL, Apply topically., Disp: , Rfl:    doxycycline (VIBRA-TABS) 100 MG tablet, Take 100 mg by mouth 2 (two) times daily., Disp: , Rfl:    famotidine (PEPCID) 40 MG tablet, Take 40 mg by mouth at bedtime., Disp: , Rfl:    HYDROcodone-acetaminophen (NORCO/VICODIN) 5-325 MG tablet, Take 1 tablet by mouth 2 (two) times daily as needed., Disp: , Rfl:    levonorgestrel-ethinyl estradiol (SEASONALE,INTROVALE,JOLESSA) 0.15-0.03 MG tablet, Take 1 tablet by mouth daily., Disp: , Rfl:    Menthol-Camphor 11-11 % CREA, Apply 1 application topically daily., Disp: , Rfl:    Multiple  Vitamins-Minerals (MULTIVITAMINS THER. W/MINERALS) TABS, Take 1 tablet by mouth daily., Disp: , Rfl:    promethazine-codeine (PHENERGAN WITH CODEINE) 6.25-10 MG/5ML syrup, Take 5 mLs by mouth every 6 (six) hours as needed for cough., Disp: , Rfl:    Respiratory Therapy Supplies (AIRS DISPOSABLE NEBULIZER) KIT, , Disp: , Rfl: 3   valsartan-hydrochlorothiazide (DIOVAN-HCT) 160-25 MG per tablet, Take 1 tablet by mouth daily. , Disp: , Rfl:    vitamin C (ASCORBIC ACID) 500 MG tablet, Take 500 mg by mouth daily., Disp: , Rfl:    Azelastine HCl 0.15 % SOLN, Take 1 tablet by mouth daily., Disp: , Rfl:   Orders Placed This Encounter  Procedures   TSH   CMP14+EGFR   LONG TERM MONITOR (3-14 DAYS)   EKG 12-Lead   PCV ECHOCARDIOGRAM COMPLETE    There are no Patient Instructions on file for this visit.   --Continue cardiac medications as reconciled in final medication list. --Return in about 6 weeks (around 06/11/2021) for Follow up, Palpitations. Or sooner if needed. --Continue follow-up with your primary care physician regarding the management of your other chronic comorbid conditions.  Patient's questions and concerns were addressed to her satisfaction. She voices understanding of the instructions provided during this encounter.   This note was created using a voice recognition software as a result there may be grammatical errors inadvertently enclosed that do not reflect the nature of this encounter. Every attempt is made to correct such errors.  Rex Kras, [place], [facility]  Pager: [phone] Office: [phone]

## 2021-04-30 ENCOUNTER — Encounter: Payer: Self-pay | Admitting: Cardiology

## 2021-04-30 ENCOUNTER — Other Ambulatory Visit: Payer: Self-pay

## 2021-04-30 ENCOUNTER — Other Ambulatory Visit (HOSPITAL_BASED_OUTPATIENT_CLINIC_OR_DEPARTMENT_OTHER): Payer: Self-pay | Admitting: Family Medicine

## 2021-04-30 ENCOUNTER — Ambulatory Visit: Payer: 59 | Admitting: Cardiology

## 2021-04-30 ENCOUNTER — Ambulatory Visit (HOSPITAL_BASED_OUTPATIENT_CLINIC_OR_DEPARTMENT_OTHER)
Admission: RE | Admit: 2021-04-30 | Discharge: 2021-04-30 | Disposition: A | Discharge status: Home / Self Care | Payer: 59 | Source: Ambulatory Visit | Attending: Family Medicine | Admitting: Family Medicine

## 2021-04-30 VITALS — BP 132/82 | HR 93 | Temp 96.8°F | Resp 16 | Ht 63.0 in | Wt 284.4 lb

## 2021-04-30 DIAGNOSIS — M79605 Pain in left leg: Secondary | ICD-10-CM | POA: Diagnosis not present

## 2021-04-30 DIAGNOSIS — M79662 Pain in left lower leg: Secondary | ICD-10-CM | POA: Diagnosis present

## 2021-04-30 DIAGNOSIS — K219 Gastro-esophageal reflux disease without esophagitis: Secondary | ICD-10-CM

## 2021-04-30 DIAGNOSIS — R002 Palpitations: Secondary | ICD-10-CM

## 2021-04-30 DIAGNOSIS — G4730 Sleep apnea, unspecified: Secondary | ICD-10-CM

## 2021-04-30 DIAGNOSIS — I10 Essential (primary) hypertension: Secondary | ICD-10-CM

## 2021-05-01 LAB — CMP14+EGFR
ALT: 142 IU/L — ABNORMAL HIGH (ref 0–32)
AST: 67 IU/L — ABNORMAL HIGH (ref 0–40)
Albumin/Globulin Ratio: 1.4 (ref 1.2–2.2)
Albumin: 4.4 g/dL (ref 3.8–4.9)
Alkaline Phosphatase: 76 IU/L (ref 44–121)
BUN/Creatinine Ratio: 14 (ref 9–23)
BUN: 13 mg/dL (ref 6–24)
Bilirubin Total: 0.3 mg/dL (ref 0.0–1.2)
CO2: 22 mmol/L (ref 20–29)
Calcium: 11.1 mg/dL — ABNORMAL HIGH (ref 8.7–10.2)
Chloride: 102 mmol/L (ref 96–106)
Creatinine, Ser: 0.95 mg/dL (ref 0.57–1.00)
Globulin, Total: 3.2 g/dL (ref 1.5–4.5)
Glucose: 83 mg/dL (ref 65–99)
Potassium: 4.6 mmol/L (ref 3.5–5.2)
Sodium: 140 mmol/L (ref 134–144)
Total Protein: 7.6 g/dL (ref 6.0–8.5)
eGFR: 72 mL/min/{1.73_m2} (ref >59)

## 2021-05-01 LAB — TSH: TSH: 1.33 u[IU]/mL (ref 0.450–4.500)

## 2021-05-02 ENCOUNTER — Ambulatory Visit: Payer: 59

## 2021-05-02 ENCOUNTER — Inpatient Hospital Stay: Payer: 59

## 2021-05-02 ENCOUNTER — Other Ambulatory Visit: Payer: Self-pay

## 2021-05-02 DIAGNOSIS — I10 Essential (primary) hypertension: Secondary | ICD-10-CM

## 2021-05-02 DIAGNOSIS — R002 Palpitations: Secondary | ICD-10-CM

## 2021-05-03 NOTE — Progress Notes (Signed)
Spoke to patient she voiced understanding. She was wondering about her cholesterol levels she said you spoke to her about it on her last ov and she thought you had order for her to check it please advise

## 2021-05-06 NOTE — Progress Notes (Signed)
Sending to all for review/completion.  AD

## 2021-05-14 ENCOUNTER — Telehealth: Payer: Self-pay

## 2021-05-14 ENCOUNTER — Other Ambulatory Visit: Payer: Self-pay

## 2021-05-14 ENCOUNTER — Ambulatory Visit
Admission: RE | Admit: 2021-05-14 | Discharge: 2021-05-14 | Disposition: A | Discharge status: Home / Self Care | Payer: 59 | Source: Ambulatory Visit | Attending: Family Medicine | Admitting: Family Medicine

## 2021-05-14 DIAGNOSIS — Z1231 Encounter for screening mammogram for malignant neoplasm of breast: Secondary | ICD-10-CM

## 2021-05-14 NOTE — Telephone Encounter (Signed)
Alexis Cook with [facility] called. She stated that they found a 10 percent burden of Afib, at certain points it was rapid, 195 BPM for 60 seconds. Pt also had 7 runs of SVT. And the report is available online.

## 2021-05-15 ENCOUNTER — Telehealth: Payer: 59 | Admitting: Cardiology

## 2021-05-15 ENCOUNTER — Encounter: Payer: Self-pay | Admitting: Cardiology

## 2021-05-15 DIAGNOSIS — R002 Palpitations: Secondary | ICD-10-CM

## 2021-05-15 DIAGNOSIS — Z712 Person consulting for explanation of examination or test findings: Secondary | ICD-10-CM

## 2021-05-15 DIAGNOSIS — G4730 Sleep apnea, unspecified: Secondary | ICD-10-CM

## 2021-05-15 DIAGNOSIS — K219 Gastro-esophageal reflux disease without esophagitis: Secondary | ICD-10-CM

## 2021-05-15 DIAGNOSIS — I10 Essential (primary) hypertension: Secondary | ICD-10-CM

## 2021-05-15 DIAGNOSIS — I480 Paroxysmal atrial fibrillation: Secondary | ICD-10-CM

## 2021-05-15 MED ORDER — ASPIRIN EC 81 MG PO TBEC: 81.0000 mg | DELAYED_RELEASE_TABLET | Freq: Every day | ORAL | 0 refills | Status: DC

## 2021-05-15 MED ORDER — METOPROLOL SUCCINATE ER 25 MG PO TB24: 25.0000 mg | ORAL_TABLET | Freq: Every morning | ORAL | 2 refills | Status: DC

## 2021-05-15 NOTE — Progress Notes (Signed)
Alexis Cook Ill Date of Birth: Sep 09, 1966 MRN: [id number] Primary Care Provider:Webb, Okey Regal, MD Primary Cardiologist: Tessa Lerner, DO, [facility] (established care 04/30/2021)  Date: 05/15/21 Last Visit: 04/30/2021   Cardiology Video Visit via Telehealth Patient encounter was conducted via Video Visit with real-time audio and video communication.  The patient has been advised of the potential risks and limitations of this mode of treatment.  The patient has also been advised to contact this office for worsening conditions or problems, and seek emergency medical treatment and/or call 911 if the patient deems either necessary.  Patient Location: Other:  Work Provider Location: Therapist, sports Complaint  Patient presents with   Results    Zio heart monitor    Follow-up    HPI  Alexis Cook is a 55 y.o.  female evaluated today via a video visit with a chief complaint of " review monitor results." Patient's past medical history and cardiovascular risk factors include: Paroxysmal atrial fibrillation,Obstructive sleep apnea, asthma, anxiety, benign essential hypertension, obesity due to excess calories, GERD.  Patient was referred to the office for evaluation of palpitations.  At the last office visit the shared decision was to proceed with an extended Holter monitor to evaluate for dysrhythmias.  Holter monitor results were available yesterday and she was noted to have atrial fibrillation accounting for 10% burden during the monitoring period.  This is a new finding for the patient and therefore she was asked to come to the office for further evaluation and management.  However, patient chose to have a virtual visit to discuss results.  Patient states that her palpitations are present but intermittent.  She had blood work after the last office visit which notes normal TSH levels.  Denies any chest pain or shortness of breath at rest or with effort related  activities.  ALLERGIES: Allergies  Allergen Reactions   Cortisone Other (See Comments)    Caused pain and lasted 7 months or more--was told it crystalizes in some people and she may be one who is allergic to the medication  DOES NOT EVER WANT THIS MEDICATION AGAIN   Hydrocodone Itching   Rofecoxib     REACTION: asthma like symptoms     MEDICATION LIST PRIOR TO VISIT: Current Outpatient Medications on File Prior to Visit  Medication Sig Dispense Refill   albuterol (PROVENTIL HFA;VENTOLIN HFA) 108 (90 BASE) MCG/ACT inhaler Inhale 2 puffs into the lungs every 6 (six) hours as needed for wheezing or shortness of breath.      albuterol (PROVENTIL) (2.5 MG/3ML) 0.083% nebulizer solution Take 2.5 mg by nebulization every 6 (six) hours as needed for wheezing or shortness of breath.      ALPRAZolam (XANAX) 0.5 MG tablet Take 1 tablet by mouth as needed. Reported on 11/05/2015     Azelastine HCl 0.15 % SOLN Take 1 tablet by mouth daily.     cetirizine (ZYRTEC) 10 MG tablet Take 10 mg by mouth daily as needed for allergies.     diclofenac Sodium (VOLTAREN) 1 % GEL Apply topically as needed.     HYDROcodone-acetaminophen (NORCO/VICODIN) 5-325 MG tablet Take 1 tablet by mouth 2 (two) times daily as needed.     levonorgestrel-ethinyl estradiol (SEASONALE,INTROVALE,JOLESSA) 0.15-0.03 MG tablet Take 1 tablet by mouth daily.     oxyCODONE (OXY IR/ROXICODONE) 5 MG immediate release tablet Take 5 mg by mouth 4 (four) times daily as needed.     promethazine-codeine (PHENERGAN WITH CODEINE) 6.25-10 MG/5ML syrup Take 5 mLs by mouth every  6 (six) hours as needed for cough.     Respiratory Therapy Supplies (AIRS DISPOSABLE NEBULIZER) KIT   3   valsartan-hydrochlorothiazide (DIOVAN-HCT) 160-25 MG per tablet Take 1 tablet by mouth daily.      famotidine (PEPCID) 40 MG tablet Take 40 mg by mouth at bedtime.     No current facility-administered medications on file prior to visit.   Pre-visit medications were  reviewed and reconciled by rooming nurse, and independently reviewed and reconciled by me at the onset of this encounter. Any new changes in medication names or instructions are found in Final Medication List  PAST MEDICAL HISTORY: Past Medical History:  Diagnosis Date   Anxiety and depression    Arthritis    knee pain-uses crutches at times in house   Asthma    as child and occ   Back pain    Hx of migraines    Hypertension    Obesity    PONV (postoperative nausea and vomiting)    Sleep apnea    mild-no cpap needed    PAST SURGICAL HISTORY: Past Surgical History:  Procedure Laterality Date   CARPAL TUNNEL RELEASE  03/09/2012   Procedure: CARPAL TUNNEL RELEASE;  Surgeon: Wynonia Sours, MD;  Location: [facility];  Service: Orthopedics;  Laterality: Right;   CESAREAN SECTION     CHOLECYSTECTOMY     COLONOSCOPY WITH PROPOFOL N/A 09/22/2018   Procedure: COLONOSCOPY WITH PROPOFOL;  Surgeon: Arta Silence, MD;  Location: [facility];  Service: Endoscopy;  Laterality: N/A;   HERNIA REPAIR     POLYPECTOMY  09/22/2018   Procedure: POLYPECTOMY;  Surgeon: Arta Silence, MD;  Location: [facility];  Service: Endoscopy;;   SUBMUCOSAL INJECTION  09/22/2018   Procedure: SUBMUCOSAL INJECTION;  Surgeon: Arta Silence, MD;  Location: [facility];  Service: Endoscopy;;  Epinepherine    FAMILY HISTORY: The patient's family history includes Cancer in her father; Hypertension in her maternal aunt, maternal grandmother, mother, and paternal aunt.   SOCIAL HISTORY:  The patient  reports that she has never smoked. She has never used smokeless tobacco. She reports current alcohol use. She reports that she does not use drugs.  Review of Systems  Constitutional: Negative for chills and fever.  HENT:  Negative for hoarse voice and nosebleeds.   Eyes:  Negative for discharge, double vision and pain.  Cardiovascular:  Positive for palpitations. Negative for chest pain, claudication,  dyspnea on exertion, leg swelling, near-syncope, orthopnea, paroxysmal nocturnal dyspnea and syncope.  Respiratory:  Negative for hemoptysis and shortness of breath.   Musculoskeletal:  Negative for muscle cramps and myalgias.  Gastrointestinal:  Negative for abdominal pain, constipation, diarrhea, hematemesis, hematochezia, melena, nausea and vomiting.  Neurological:  Negative for dizziness and light-headedness.  All other systems reviewed and are negative.  PHYSICAL EXAM: The following examination entries are based on visual observation of the patient via telemedicine video: CONSTITUTIONAL: No acute distress by facial expression or demeanor during the telemedicine video.  SKIN: No apparent cyanosis, pallor, jaundice of the face by telemedicine video. HEAD: Normocephalic and atraumatic by telemedicine video.  EYES: No apparent scleral icterus by telemedicine video. MOUTH/THROAT: Lips do not appear to be dry during telemedicine video. NECK: No apparent JVD or thyromegaly noted on observation of neck during telemedicine video. LUNGS: No audible stridor or wheezing noted during telemedicine audio. CARDIOVASCULAR: Not examined during telemedicine video NEUROLOGIC: No apparent focal asymmetry of face noted during telemedicine video PSYCHIATRIC: Normal mood and affect, normal behavior, cooperative during  telemedicine video  CARDIAC DATABASE: EKG: 04/30/2021: Normal sinus rhythm, 75 bpm, normal axis, without underlying ischemia or injury pattern.   Echocardiogram: 05/02/2021: Left ventricle cavity is normal in size and wall thickness. Normal global wall motion. Normal LV systolic function with EF 66%. Normal diastolic filling pattern. Mild tricuspid regurgitation No evidence of pulmonary hypertension.  Stress Testing:  No results found for this or any previous visit from the past 1095 days.   Heart Catheterization: None  7 day extended Holter monitor: Dominant rhythm normal sinus  rhythm, followed by atrial fibrillation. Normal Sinus heart rate 55-138 bpm.  Avg HR 80 bpm. Atrial fibrillation heart rate 67 - 233 bpm, average heart rate 112 bpm. Atrial fibrillation burden 10% (9 hours 7 minutes) No ventricular tachycardia, high grade AV block, pauses (3 seconds or longer). Total ventricular ectopic burden <1%. Total supraventricular ectopic burden <1%. Patient triggered events: 18.  Underlying rhythm predominantly sinus followed by sinus tachycardia.  She had episodes of symptomatic atrial fibrillation (patient reported fluttering/racing on 05/07/2021).  LABORATORY DATA: CBC Latest Ref Rng & Units 09/23/2018 09/22/2018 03/09/2012  WBC 4.0 - 10.5 K/uL 7.8 7.1 -  Hemoglobin 12.0 - 15.0 g/dL 12.0 12.5 13.4  Hematocrit 36.0 - 46.0 % 37.5 39.0 -  Platelets 150 - 400 K/uL 289 283 -    CMP Latest Ref Rng & Units 04/30/2021 09/23/2018 03/04/2012  Glucose 65 - 99 mg/dL 83 103(H) 91  BUN 6 - 24 mg/dL $Remove'13 7 13  'RgVYwFG$ Creatinine 0.57 - 1.00 mg/dL 0.95 0.77 0.85  Sodium 134 - 144 mmol/L 140 141 138  Potassium 3.5 - 5.2 mmol/L 4.6 3.2(L) 3.9  Chloride 96 - 106 mmol/L 102 106 102  CO2 20 - 29 mmol/L $RemoveB'22 28 27  'yWeMxyyQ$ Calcium 8.7 - 10.2 mg/dL 11.1(H) 9.1 9.5  Total Protein 6.0 - 8.5 g/dL 7.6 - -  Total Bilirubin 0.0 - 1.2 mg/dL 0.3 - -  Alkaline Phos 44 - 121 IU/L 76 - -  AST 0 - 40 IU/L 67(H) - -  ALT 0 - 32 IU/L 142(H) - -    Lipid Panel  No results found for: CHOL, TRIG, HDL, CHOLHDL, VLDL, LDLCALC, LDLDIRECT, LABVLDL  No results found for: HGBA1C No components found for: NTPROBNP Lab Results  Component Value Date   TSH 1.330 04/30/2021    Cardiac Panel (last 3 results) No results for input(s): CKTOTAL, CKMB, TROPONINIHS, RELINDX in the last 72 hours.  IMPRESSION:    ICD-10-CM   1. Paroxysmal A-fib (HCC)  I48.0 metoprolol succinate (TOPROL XL) 25 MG 24 hr tablet    aspirin EC 81 MG tablet    Hemoglobin A1c    PCV MYOCARDIAL PERFUSION WO LEXISCAN    CT CARDIAC SCORING ([facility]  LOCATIONS ONLY)    2. Palpitations  R00.2 metoprolol succinate (TOPROL XL) 25 MG 24 hr tablet    3. Benign hypertension  I10     4. Gastroesophageal reflux disease, unspecified whether esophagitis present  K21.9     5. Sleep apnea, unspecified type  G47.30     6. Encounter to discuss test results  Z71.2        RECOMMENDATIONS: TAKARI DUNCOMBE is a 55 y.o. female whose past medical history and cardiovascular risk factors include: Paroxysmal atrial fibrillation,Obstructive sleep apnea, asthma, anxiety, benign essential hypertension, obesity due to excess calories, GERD.  Paroxysmal atrial fibrillation: Newly discovered Reviewed the results of the extended Holter monitor. Rate control: Toprol-XL 25 mg p.o. every morning Rhythm control: N/A  Thromboembolic  prophylaxis: Aspirin 81 mg p.o. daily CHA2DS2-VASc SCORE is 2 which correlates to 2.2 % risk of stroke per year (age, hypertension). I had a long and detailed discussion with the patient regarding the incidence, etiology, pathophysiology, prognosis, and therapeutic options for atrial fibrillation.  Specifically we discussed oral anticoagulation for stroke prevention.  However, shared decision was to start ASA $RemoveB'81mg'eeuCWJmI$  po qday for now and will discuss it further at the next office visit.  Will check hemoglobin A1c and coronary calcium score for further restratification. Nuclear stress test recommended to evaluate for reversible ischemia.  Benign essential hypertension: Medications reconciled. Currently managed by primary care provider.  FINAL MEDICATION LIST END OF ENCOUNTER: Meds ordered this encounter  Medications   metoprolol succinate (TOPROL XL) 25 MG 24 hr tablet    Sig: Take 1 tablet (25 mg total) by mouth every morning.    Dispense:  30 tablet    Refill:  2   aspirin EC 81 MG tablet    Sig: Take 1 tablet (81 mg total) by mouth daily. Swallow whole.    Dispense:  90 tablet    Refill:  0    Medications Discontinued  During This Encounter  Medication Reason   doxycycline (VIBRA-TABS) 100 MG tablet Error   Menthol-Camphor 11-11 % CREA Error   Multiple Vitamins-Minerals (MULTIVITAMINS THER. W/MINERALS) TABS Error   vitamin C (ASCORBIC ACID) 500 MG tablet Error     Current Outpatient Medications:    albuterol (PROVENTIL HFA;VENTOLIN HFA) 108 (90 BASE) MCG/ACT inhaler, Inhale 2 puffs into the lungs every 6 (six) hours as needed for wheezing or shortness of breath. , Disp: , Rfl:    albuterol (PROVENTIL) (2.5 MG/3ML) 0.083% nebulizer solution, Take 2.5 mg by nebulization every 6 (six) hours as needed for wheezing or shortness of breath. , Disp: , Rfl:    ALPRAZolam (XANAX) 0.5 MG tablet, Take 1 tablet by mouth as needed. Reported on 11/05/2015, Disp: , Rfl:    aspirin EC 81 MG tablet, Take 1 tablet (81 mg total) by mouth daily. Swallow whole., Disp: 90 tablet, Rfl: 0   Azelastine HCl 0.15 % SOLN, Take 1 tablet by mouth daily., Disp: , Rfl:    cetirizine (ZYRTEC) 10 MG tablet, Take 10 mg by mouth daily as needed for allergies., Disp: , Rfl:    diclofenac Sodium (VOLTAREN) 1 % GEL, Apply topically as needed., Disp: , Rfl:    HYDROcodone-acetaminophen (NORCO/VICODIN) 5-325 MG tablet, Take 1 tablet by mouth 2 (two) times daily as needed., Disp: , Rfl:    levonorgestrel-ethinyl estradiol (SEASONALE,INTROVALE,JOLESSA) 0.15-0.03 MG tablet, Take 1 tablet by mouth daily., Disp: , Rfl:    metoprolol succinate (TOPROL XL) 25 MG 24 hr tablet, Take 1 tablet (25 mg total) by mouth every morning., Disp: 30 tablet, Rfl: 2   oxyCODONE (OXY IR/ROXICODONE) 5 MG immediate release tablet, Take 5 mg by mouth 4 (four) times daily as needed., Disp: , Rfl:    promethazine-codeine (PHENERGAN WITH CODEINE) 6.25-10 MG/5ML syrup, Take 5 mLs by mouth every 6 (six) hours as needed for cough., Disp: , Rfl:    Respiratory Therapy Supplies (AIRS DISPOSABLE NEBULIZER) KIT, , Disp: , Rfl: 3   valsartan-hydrochlorothiazide (DIOVAN-HCT) 160-25 MG per  tablet, Take 1 tablet by mouth daily. , Disp: , Rfl:    famotidine (PEPCID) 40 MG tablet, Take 40 mg by mouth at bedtime., Disp: , Rfl:   Orders Placed This Encounter  Procedures   CT CARDIAC SCORING ([facility] LOCATIONS ONLY)   Hemoglobin A1c  PCV MYOCARDIAL PERFUSION WO LEXISCAN    Time:   Today, I have spent 37 minutes with the patient with telehealth technology discussing the above problems.    --Continue cardiac medications as reconciled in final medication list. --Return in about 4 weeks (around 06/12/2021) for Follow up, A. fib, Review test results. Or sooner if needed. --Continue follow-up with your primary care physician regarding the management of your other chronic comorbid conditions.  Patient's questions and concerns were addressed to her satisfaction. She voices understanding of the instructions provided during this encounter.   This note was created using a voice recognition software as a result there may be grammatical errors inadvertently enclosed that do not reflect the nature of this encounter. Every attempt is made to correct such errors.  Rex Kras, [place], [facility]  Pager: [phone] Office: [phone]

## 2021-06-12 ENCOUNTER — Other Ambulatory Visit: Payer: Self-pay

## 2021-06-12 ENCOUNTER — Ambulatory Visit
Admission: RE | Admit: 2021-06-12 | Discharge: 2021-06-12 | Disposition: A | Discharge status: Home / Self Care | Payer: 59 | Source: Ambulatory Visit | Attending: Cardiology | Admitting: Cardiology

## 2021-06-12 DIAGNOSIS — I480 Paroxysmal atrial fibrillation: Secondary | ICD-10-CM

## 2021-06-13 ENCOUNTER — Ambulatory Visit: Payer: 59 | Admitting: Cardiology

## 2021-06-13 ENCOUNTER — Encounter: Payer: Self-pay | Admitting: Cardiology

## 2021-06-13 VITALS — BP 149/87 | HR 89 | Temp 97.5°F | Resp 16 | Ht 63.0 in | Wt 284.0 lb

## 2021-06-13 DIAGNOSIS — G4730 Sleep apnea, unspecified: Secondary | ICD-10-CM

## 2021-06-13 DIAGNOSIS — R002 Palpitations: Secondary | ICD-10-CM

## 2021-06-13 DIAGNOSIS — K219 Gastro-esophageal reflux disease without esophagitis: Secondary | ICD-10-CM

## 2021-06-13 DIAGNOSIS — I10 Essential (primary) hypertension: Secondary | ICD-10-CM

## 2021-06-13 DIAGNOSIS — I480 Paroxysmal atrial fibrillation: Secondary | ICD-10-CM

## 2021-06-13 MED ORDER — METOPROLOL SUCCINATE ER 50 MG PO TB24: 50.0000 mg | ORAL_TABLET | Freq: Every day | ORAL | 0 refills | Status: DC

## 2021-06-13 NOTE — Progress Notes (Signed)
Date:  04/30/2021   ID:  Alexis Cook, DOB 1966-06-22, MRN [id number]  PCP:  Shirlean Mylar, MD  Cardiologist:  Tessa Lerner, DO, [facility] (established care 04/30/2021)  Date: 06/13/21 Last Office Visit: 04/30/2021   Chief Complaint  Patient presents with   Atrial Fibrillation   Follow-up    6 week    HPI  Alexis Cook is a 55 y.o. female who presents to the office with a chief complaint of " atrial fibrillation follow-up." Patient's past medical history and cardiovascular risk factors include: Paroxysmal atrial fibrillation, sleep apnea not on CPAP, asthma, anxiety, benign essential hypertension, obesity due to excess calories, GERD.  Patient was originally seen in July 2022 for evaluation of palpitations.  She underwent a Holter monitor which noted paroxysmal atrial fibrillation with a burden of approximately 10%.  Patient had a sooner than scheduled visit via telemedicine during which we discussed the diagnosis and management of paroxysmal atrial fibrillation.  At that time the shared decision was to proceed with rate control strategy and patient was started on Toprol-XL.  Patient chose not to be on oral anticoagulation despite her CHA2DS2-VASc score.  But she did agree to start aspirin 81 mg p.o. daily.  She now presents for follow-up.  Since last televisit patient states that she is doing well from a cardiovascular standpoint.  She denies any chest pain, palpitations, shortness of breath or volume overload.  She is tolerating metoprolol well without any side effects or intolerances.  And she chooses to be on antiplatelet therapy for now.  Results of the echocardiogram and coronary calcium score reviewed.  FUNCTIONAL STATUS: No structured exercise program or daily routine.   ALLERGIES: Allergies  Allergen Reactions   Cortisone Other (See Comments)    Caused pain and lasted 7 months or more--was told it crystalizes in some people and she may be one who is allergic to the  medication  DOES NOT EVER WANT THIS MEDICATION AGAIN   Hydrocodone Itching   Rofecoxib     REACTION: asthma like symptoms    MEDICATION LIST PRIOR TO VISIT: Current Meds  Medication Sig   albuterol (PROVENTIL HFA;VENTOLIN HFA) 108 (90 BASE) MCG/ACT inhaler Inhale 2 puffs into the lungs every 6 (six) hours as needed for wheezing or shortness of breath.    albuterol (PROVENTIL) (2.5 MG/3ML) 0.083% nebulizer solution Take 2.5 mg by nebulization every 6 (six) hours as needed for wheezing or shortness of breath.    ALPRAZolam (XANAX) 0.5 MG tablet Take 1 tablet by mouth as needed. Reported on 11/05/2015   aspirin EC 81 MG tablet Take 1 tablet (81 mg total) by mouth daily. Swallow whole.   Azelastine HCl 0.15 % SOLN Take 1 tablet by mouth daily.   cetirizine (ZYRTEC) 10 MG tablet Take 10 mg by mouth daily as needed for allergies.   diclofenac Sodium (VOLTAREN) 1 % GEL Apply topically as needed.   famotidine (PEPCID) 40 MG tablet Take 40 mg by mouth at bedtime.   levonorgestrel-ethinyl estradiol (SEASONALE,INTROVALE,JOLESSA) 0.15-0.03 MG tablet Take 1 tablet by mouth daily.   metoprolol succinate (TOPROL XL) 50 MG 24 hr tablet Take 1 tablet (50 mg total) by mouth daily.   oxyCODONE (OXY IR/ROXICODONE) 5 MG immediate release tablet Take 5 mg by mouth 4 (four) times daily as needed.   Respiratory Therapy Supplies (AIRS DISPOSABLE NEBULIZER) KIT    valsartan-hydrochlorothiazide (DIOVAN-HCT) 160-25 MG per tablet Take 1 tablet by mouth daily.    [DISCONTINUED] metoprolol succinate (TOPROL XL) 25  MG 24 hr tablet Take 1 tablet (25 mg total) by mouth every morning.     PAST MEDICAL HISTORY: Past Medical History:  Diagnosis Date   Anxiety and depression    Arthritis    knee pain-uses crutches at times in house   Asthma    as child and occ   Back pain    Hx of migraines    Hypertension    Obesity    PONV (postoperative nausea and vomiting)    Sleep apnea    mild-no cpap needed    PAST  SURGICAL HISTORY: Past Surgical History:  Procedure Laterality Date   CARPAL TUNNEL RELEASE  03/09/2012   Procedure: CARPAL TUNNEL RELEASE;  Surgeon: Wynonia Sours, MD;  Location: [place] [facility];  Service: Orthopedics;  Laterality: Right;   CESAREAN SECTION     CHOLECYSTECTOMY     COLONOSCOPY WITH PROPOFOL N/A 09/22/2018   Procedure: COLONOSCOPY WITH PROPOFOL;  Surgeon: Arta Silence, MD;  Location: [facility];  Service: Endoscopy;  Laterality: N/A;   HERNIA REPAIR     POLYPECTOMY  09/22/2018   Procedure: POLYPECTOMY;  Surgeon: Arta Silence, MD;  Location: [facility];  Service: Endoscopy;;   SUBMUCOSAL INJECTION  09/22/2018   Procedure: SUBMUCOSAL INJECTION;  Surgeon: Arta Silence, MD;  Location: [facility];  Service: Endoscopy;;  Epinepherine    FAMILY HISTORY: The patient family history includes Cancer in her father; Hypertension in her maternal aunt, maternal grandmother, mother, and paternal aunt.  SOCIAL HISTORY:  The patient  reports that she has never smoked. She has never used smokeless tobacco. She reports current alcohol use. She reports that she does not use drugs.  REVIEW OF SYSTEMS: Review of Systems  Constitutional: Negative for chills and fever.  HENT:  Negative for hoarse voice and nosebleeds.   Eyes:  Negative for discharge, double vision and pain.  Cardiovascular:  Negative for chest pain, claudication, dyspnea on exertion, leg swelling, near-syncope, orthopnea, palpitations, paroxysmal nocturnal dyspnea and syncope.  Respiratory:  Negative for hemoptysis and shortness of breath.   Musculoskeletal:  Negative for muscle cramps and myalgias.  Gastrointestinal:  Negative for abdominal pain, constipation, diarrhea, hematemesis, hematochezia, melena, nausea and vomiting.  Neurological:  Negative for excessive daytime sleepiness and headaches.   PHYSICAL EXAM: Vitals with BMI 06/13/2021 04/30/2021 09/23/2018  Height $Remov'5\' 3"'KVgJck$  $Remove'5\' 3"'AjYjvlC$  -  Weight 284 lbs 284  lbs 6 oz -  BMI 03.50 09.38 -  Systolic 182 993 716  Diastolic 87 82 52  Pulse 89 93 70    CONSTITUTIONAL: Well-developed and well-nourished. No acute distress.  SKIN: Skin is warm and dry. No rash noted. No cyanosis. No pallor. No jaundice HEAD: Normocephalic and atraumatic.  EYES: No scleral icterus MOUTH/THROAT: Moist oral membranes.  NECK: No JVD present. No thyromegaly noted. No carotid bruits  LYMPHATIC: No visible cervical adenopathy.  CHEST Normal respiratory effort. No intercostal retractions  LUNGS: Clear to auscultation bilaterally.  No stridor. No wheezes. No rales.  CARDIOVASCULAR: Regular rate and rhythm, positive S1-S2, no murmurs rubs or gallops appreciated. ABDOMINAL: Obese, soft, nontender, nondistended, positive bowel sounds all 4 quadrants. No apparent ascites.  EXTREMITIES: No peripheral edema.  HEMATOLOGIC: No significant bruising NEUROLOGIC: Oriented to person, place, and time. Nonfocal. Normal muscle tone.  PSYCHIATRIC: Normal mood and affect. Normal behavior. Cooperative  CARDIAC DATABASE: EKG: 06/13/2021: Normal sinus rhythm, 67 bpm, normal axis, LAE, without underlying ischemia or injury pattern.   Echocardiogram: 05/02/2021: Left ventricle cavity is normal in size and wall  thickness. Normal global wall motion. Normal LV systolic function with EF 66%. Normal diastolic filling pattern. Mild tricuspid regurgitation No evidence of pulmonary hypertension.  Stress Testing:  No results found for this or any previous visit from the past 1095 days.  Heart Catheterization: None  7 day extended Holter monitor: 04/2021 Dominant rhythm normal sinus rhythm, followed by atrial fibrillation. Normal Sinus heart rate 55-138 bpm.  Avg HR 80 bpm. Atrial fibrillation heart rate 67 - 233 bpm, average heart rate 112 bpm. Atrial fibrillation burden 10% (9 hours 7 minutes) No ventricular tachycardia, high grade AV block, pauses (3 seconds or longer). Total ventricular  ectopic burden <1%. Total supraventricular ectopic burden <1%. Patient triggered events: 18.  Underlying rhythm predominantly sinus followed by sinus tachycardia.  She had episodes of symptomatic atrial fibrillation (patient reported fluttering/racing on 05/07/2021).  Calcium scoring: 06/12/2021: Total coronary calcium score: 0  LABORATORY DATA: CBC Latest Ref Rng & Units 09/23/2018 09/22/2018 03/09/2012  WBC 4.0 - 10.5 K/uL 7.8 7.1 -  Hemoglobin 12.0 - 15.0 g/dL 12.0 12.5 13.4  Hematocrit 36.0 - 46.0 % 37.5 39.0 -  Platelets 150 - 400 K/uL 289 283 -    CMP Latest Ref Rng & Units 04/30/2021 09/23/2018 03/04/2012  Glucose 65 - 99 mg/dL 83 103(H) 91  BUN 6 - 24 mg/dL $Remove'13 7 13  'HkqDXqQ$ Creatinine 0.57 - 1.00 mg/dL 0.95 0.77 0.85  Sodium 134 - 144 mmol/L 140 141 138  Potassium 3.5 - 5.2 mmol/L 4.6 3.2(L) 3.9  Chloride 96 - 106 mmol/L 102 106 102  CO2 20 - 29 mmol/L $RemoveB'22 28 27  'SasfKCLW$ Calcium 8.7 - 10.2 mg/dL 11.1(H) 9.1 9.5  Total Protein 6.0 - 8.5 g/dL 7.6 - -  Total Bilirubin 0.0 - 1.2 mg/dL 0.3 - -  Alkaline Phos 44 - 121 IU/L 76 - -  AST 0 - 40 IU/L 67(H) - -  ALT 0 - 32 IU/L 142(H) - -    Lipid Panel  No results found for: CHOL, TRIG, HDL, CHOLHDL, VLDL, LDLCALC, LDLDIRECT, LABVLDL  No components found for: NTPROBNP No results for input(s): PROBNP in the last 8760 hours. Recent Labs    04/30/21 1611  TSH 1.330    BMP Recent Labs    04/30/21 1611  NA 140  K 4.6  CL 102  CO2 22  GLUCOSE 83  BUN 13  CREATININE 0.95  CALCIUM 11.1*    HEMOGLOBIN A1C No results found for: HGBA1C, MPG  External Labs: Collected: 07/04/2020 Creatinine 0.82 mg/dL. eGFR: >60 mL/min per 1.73 m Lipid profile: Total cholesterol 165, triglycerides 56, HDL 56, LDL 98, non HDL 109 Hemoglobin 13.8 g/dL, hematocrit 41.1% AST 17, ALT 18, alkaline phosphatase 55.  IMPRESSION:    ICD-10-CM   1. Paroxysmal A-fib (HCC)  I48.0 EKG 12-Lead    metoprolol succinate (TOPROL XL) 50 MG 24 hr tablet    2.  Palpitations  R00.2     3. Benign hypertension  I10 EKG 12-Lead    4. GERD  K21.9     5. Sleep apnea, unspecified type  G47.30        RECOMMENDATIONS: Alexis Cook is a 55 y.o. female whose past medical history and cardiac risk factors include: Paroxysmal atrial fibrillation, sleep apnea not on CPAP, asthma, anxiety, benign essential hypertension, obesity due to excess calories, GERD.  Paroxysmal atrial fibrillation: Recently discovered in July 2022. Reviewed the results of the extended Holter monitor. Rate control: Toprol Rhythm control: N/A  Thromboembolic prophylaxis: Aspirin CHA2DS2-VASc SCORE  is 2 which correlates to 2.2 % risk of stroke per year (gender, hypertension). Increase metoprolol to 50 mg p.o. daily. Discussed the role of anticoagulation with regards to thromboembolic prophylaxis and also risks as well as alternatives. Patient chooses to be on aspirin 81 mg p.o. daily for now. Recommended that she follow-up with PCP and have another sleep study and if she continues to have sleep apnea she should be treated accordingly. Educated on the importance of improving her modifiable cardiovascular risk factors including blood pressure, addressing sleep apnea, increasing physical activity as tolerated with a goal of 30 minutes a day 5 days a week. Echocardiogram results reviewed. Plan nuclear stress test given new diagnosis of atrial fibrillation to rule out ischemic burden EKG shows normal sinus rhythm without underlying ischemia  Benign essential hypertension: Not well controlled at today's office visit. Patient stated the numbers are better controlled at home. Medications reconciled. Up titration of metoprolol succinate as discussed above Low-salt diet recommended Currently managed by primary care provider. If additional medication is required consider Aldactone.  Obesity, due to excess calories: Body mass index is 50.31 kg/m. I reviewed with the patient the  importance of diet, regular physical activity/exercise, weight loss.   Patient is educated on increasing physical activity gradually as tolerated.  With the goal of moderate intensity exercise for 30 minutes a day 5 days a week.   FINAL MEDICATION LIST END OF ENCOUNTER: Meds ordered this encounter  Medications   metoprolol succinate (TOPROL XL) 50 MG 24 hr tablet    Sig: Take 1 tablet (50 mg total) by mouth daily.    Dispense:  90 tablet    Refill:  0     Medications Discontinued During This Encounter  Medication Reason   HYDROcodone-acetaminophen (NORCO/VICODIN) 5-325 MG tablet Error   promethazine-codeine (PHENERGAN WITH CODEINE) 6.25-10 MG/5ML syrup Error   metoprolol succinate (TOPROL XL) 25 MG 24 hr tablet Dose change     Current Outpatient Medications:    albuterol (PROVENTIL HFA;VENTOLIN HFA) 108 (90 BASE) MCG/ACT inhaler, Inhale 2 puffs into the lungs every 6 (six) hours as needed for wheezing or shortness of breath. , Disp: , Rfl:    albuterol (PROVENTIL) (2.5 MG/3ML) 0.083% nebulizer solution, Take 2.5 mg by nebulization every 6 (six) hours as needed for wheezing or shortness of breath. , Disp: , Rfl:    ALPRAZolam (XANAX) 0.5 MG tablet, Take 1 tablet by mouth as needed. Reported on 11/05/2015, Disp: , Rfl:    aspirin EC 81 MG tablet, Take 1 tablet (81 mg total) by mouth daily. Swallow whole., Disp: 90 tablet, Rfl: 0   Azelastine HCl 0.15 % SOLN, Take 1 tablet by mouth daily., Disp: , Rfl:    cetirizine (ZYRTEC) 10 MG tablet, Take 10 mg by mouth daily as needed for allergies., Disp: , Rfl:    diclofenac Sodium (VOLTAREN) 1 % GEL, Apply topically as needed., Disp: , Rfl:    famotidine (PEPCID) 40 MG tablet, Take 40 mg by mouth at bedtime., Disp: , Rfl:    levonorgestrel-ethinyl estradiol (SEASONALE,INTROVALE,JOLESSA) 0.15-0.03 MG tablet, Take 1 tablet by mouth daily., Disp: , Rfl:    metoprolol succinate (TOPROL XL) 50 MG 24 hr tablet, Take 1 tablet (50 mg total) by mouth  daily., Disp: 90 tablet, Rfl: 0   oxyCODONE (OXY IR/ROXICODONE) 5 MG immediate release tablet, Take 5 mg by mouth 4 (four) times daily as needed., Disp: , Rfl:    Respiratory Therapy Supplies (AIRS DISPOSABLE NEBULIZER) KIT, , Disp: ,  Rfl: 3   valsartan-hydrochlorothiazide (DIOVAN-HCT) 160-25 MG per tablet, Take 1 tablet by mouth daily. , Disp: , Rfl:   Orders Placed This Encounter  Procedures   EKG 12-Lead    There are no Patient Instructions on file for this visit.   --Continue cardiac medications as reconciled in final medication list. --Return in about 6 months (around 12/14/2021) for Follow up, A. fib. Or sooner if needed. --Continue follow-up with your primary care physician regarding the management of your other chronic comorbid conditions.  Patient's questions and concerns were addressed to her satisfaction. She voices understanding of the instructions provided during this encounter.   This note was created using a voice recognition software as a result there may be grammatical errors inadvertently enclosed that do not reflect the nature of this encounter. Every attempt is made to correct such errors.  Total time spent 37 minutes.  Rex Kras, [place], [facility]  Pager: [phone] Office: [phone]

## 2021-06-17 NOTE — Telephone Encounter (Signed)
Error

## 2021-07-15 ENCOUNTER — Ambulatory Visit: Payer: 59

## 2021-07-15 ENCOUNTER — Other Ambulatory Visit: Payer: Self-pay

## 2021-07-15 DIAGNOSIS — I480 Paroxysmal atrial fibrillation: Secondary | ICD-10-CM

## 2021-07-15 LAB — PCV MYOCARDIAL PERFUSION WO LEXISCAN
Base ST Depression (mm): 0 mm
ST Depression (mm): 0 mm

## 2021-07-18 NOTE — Progress Notes (Signed)
Called and spoke to pt, pt voiced understanding.

## 2021-08-09 ENCOUNTER — Other Ambulatory Visit: Payer: Self-pay | Admitting: Cardiology

## 2021-08-09 DIAGNOSIS — I480 Paroxysmal atrial fibrillation: Secondary | ICD-10-CM

## 2021-08-09 DIAGNOSIS — R002 Palpitations: Secondary | ICD-10-CM

## 2021-08-10 ENCOUNTER — Other Ambulatory Visit: Payer: Self-pay | Admitting: Cardiology

## 2021-08-10 DIAGNOSIS — I480 Paroxysmal atrial fibrillation: Secondary | ICD-10-CM

## 2021-08-20 ENCOUNTER — Other Ambulatory Visit: Payer: Self-pay | Admitting: Cardiology

## 2021-08-20 DIAGNOSIS — I480 Paroxysmal atrial fibrillation: Secondary | ICD-10-CM

## 2021-08-20 DIAGNOSIS — R002 Palpitations: Secondary | ICD-10-CM

## 2021-11-15 ENCOUNTER — Other Ambulatory Visit: Payer: Self-pay | Admitting: Cardiology

## 2021-11-15 DIAGNOSIS — I480 Paroxysmal atrial fibrillation: Secondary | ICD-10-CM

## 2021-11-15 DIAGNOSIS — R002 Palpitations: Secondary | ICD-10-CM

## 2021-12-13 ENCOUNTER — Ambulatory Visit: Payer: 59 | Admitting: Cardiology

## 2021-12-24 ENCOUNTER — Ambulatory Visit: Payer: 59 | Admitting: Cardiology

## 2022-02-10 ENCOUNTER — Other Ambulatory Visit: Payer: Self-pay | Admitting: Cardiology

## 2022-02-10 DIAGNOSIS — R002 Palpitations: Secondary | ICD-10-CM

## 2022-02-10 DIAGNOSIS — I480 Paroxysmal atrial fibrillation: Secondary | ICD-10-CM

## 2022-02-15 ENCOUNTER — Other Ambulatory Visit: Payer: Self-pay | Admitting: Cardiology

## 2022-02-15 DIAGNOSIS — I480 Paroxysmal atrial fibrillation: Secondary | ICD-10-CM

## 2022-02-19 ENCOUNTER — Other Ambulatory Visit: Payer: Self-pay | Admitting: Cardiology

## 2022-02-19 DIAGNOSIS — R002 Palpitations: Secondary | ICD-10-CM

## 2022-02-19 DIAGNOSIS — I480 Paroxysmal atrial fibrillation: Secondary | ICD-10-CM

## 2022-04-23 ENCOUNTER — Other Ambulatory Visit: Payer: Self-pay | Admitting: Family Medicine

## 2022-04-23 DIAGNOSIS — Z1231 Encounter for screening mammogram for malignant neoplasm of breast: Secondary | ICD-10-CM

## 2022-05-16 ENCOUNTER — Ambulatory Visit: Payer: 59

## 2022-05-22 ENCOUNTER — Other Ambulatory Visit: Payer: Self-pay | Admitting: Cardiology

## 2022-05-22 DIAGNOSIS — R002 Palpitations: Secondary | ICD-10-CM

## 2022-05-22 DIAGNOSIS — I480 Paroxysmal atrial fibrillation: Secondary | ICD-10-CM

## 2022-05-27 ENCOUNTER — Ambulatory Visit
Admission: RE | Admit: 2022-05-27 | Discharge: 2022-05-27 | Disposition: A | Discharge status: Home / Self Care | Payer: 59 | Source: Ambulatory Visit | Attending: Family Medicine | Admitting: Family Medicine

## 2022-05-27 DIAGNOSIS — Z1231 Encounter for screening mammogram for malignant neoplasm of breast: Secondary | ICD-10-CM

## 2022-05-30 ENCOUNTER — Other Ambulatory Visit: Payer: Self-pay | Admitting: Gastroenterology

## 2022-05-30 DIAGNOSIS — R1310 Dysphagia, unspecified: Secondary | ICD-10-CM

## 2022-06-17 ENCOUNTER — Ambulatory Visit
Admission: RE | Admit: 2022-06-17 | Discharge: 2022-06-17 | Disposition: A | Discharge status: Home / Self Care | Payer: 59 | Source: Ambulatory Visit | Attending: Gastroenterology | Admitting: Gastroenterology

## 2022-06-17 DIAGNOSIS — R1310 Dysphagia, unspecified: Secondary | ICD-10-CM

## 2022-06-19 ENCOUNTER — Telehealth (HOSPITAL_COMMUNITY): Payer: Self-pay

## 2022-06-19 NOTE — Telephone Encounter (Signed)
Attempted to contact patient to schedule Modified Barium Swallow - left voicemail. 

## 2022-07-02 ENCOUNTER — Telehealth (HOSPITAL_COMMUNITY): Payer: Self-pay

## 2022-07-02 NOTE — Telephone Encounter (Signed)
2nd attempt to contact patient to schedule Modified Barium Swallow - left voicemail. 

## 2022-07-07 ENCOUNTER — Other Ambulatory Visit: Payer: Self-pay | Admitting: Gastroenterology

## 2022-07-09 ENCOUNTER — Telehealth (HOSPITAL_COMMUNITY): Payer: Self-pay

## 2022-07-09 NOTE — Telephone Encounter (Signed)
3rd attempt to contact patient to schedule Modified Barium Swallow - left voicemail. Will cancel order if no return call by 9/27.

## 2022-07-24 ENCOUNTER — Other Ambulatory Visit (HOSPITAL_COMMUNITY): Payer: Self-pay

## 2022-07-24 DIAGNOSIS — R1310 Dysphagia, unspecified: Secondary | ICD-10-CM

## 2022-08-06 ENCOUNTER — Ambulatory Visit (HOSPITAL_COMMUNITY)
Admission: RE | Admit: 2022-08-06 | Discharge: 2022-08-06 | Disposition: A | Discharge status: Home / Self Care | Payer: 59 | Source: Ambulatory Visit | Attending: Family Medicine | Admitting: Family Medicine

## 2022-08-06 DIAGNOSIS — R1310 Dysphagia, unspecified: Secondary | ICD-10-CM | POA: Insufficient documentation

## 2022-08-16 ENCOUNTER — Other Ambulatory Visit: Payer: Self-pay | Admitting: Cardiology

## 2022-08-16 DIAGNOSIS — I480 Paroxysmal atrial fibrillation: Secondary | ICD-10-CM

## 2022-08-17 ENCOUNTER — Other Ambulatory Visit: Payer: Self-pay | Admitting: Cardiology

## 2022-08-17 DIAGNOSIS — R002 Palpitations: Secondary | ICD-10-CM

## 2022-08-17 DIAGNOSIS — I480 Paroxysmal atrial fibrillation: Secondary | ICD-10-CM

## 2022-08-21 ENCOUNTER — Other Ambulatory Visit: Payer: Self-pay

## 2022-08-21 DIAGNOSIS — I480 Paroxysmal atrial fibrillation: Secondary | ICD-10-CM

## 2022-08-21 DIAGNOSIS — R002 Palpitations: Secondary | ICD-10-CM

## 2022-08-21 MED ORDER — METOPROLOL SUCCINATE ER 25 MG PO TB24: ORAL_TABLET | ORAL | 0 refills | Status: DC

## 2022-08-27 ENCOUNTER — Ambulatory Visit: Payer: 59

## 2022-08-27 VITALS — BP 128/80 | HR 77 | Ht 63.0 in | Wt 291.0 lb

## 2022-08-27 DIAGNOSIS — I480 Paroxysmal atrial fibrillation: Secondary | ICD-10-CM

## 2022-08-27 DIAGNOSIS — I10 Essential (primary) hypertension: Secondary | ICD-10-CM

## 2022-08-27 DIAGNOSIS — R002 Palpitations: Secondary | ICD-10-CM

## 2022-08-27 MED ORDER — METOPROLOL SUCCINATE ER 25 MG PO TB24: ORAL_TABLET | ORAL | 3 refills | Status: DC

## 2022-08-27 MED ORDER — ASPIRIN 81 MG PO TBEC: DELAYED_RELEASE_TABLET | ORAL | 3 refills | Status: DC

## 2022-08-27 NOTE — Progress Notes (Signed)
Date:  04/30/2021   ID:  Alexis Cook, DOB 1966-03-17, MRN [id number]  PCP:  Jonathon Jordan, MD  Cardiologist:  Rex Kras, DO, [facility] (established care 04/30/2021)  Date: 08/27/22 Last Office Visit: 04/30/2021   Chief Complaint  Patient presents with   Follow-up   Medication Management   Hypertension    HPI  Alexis Cook is a 56 y.o. female who presents to the office with a chief complaint of " atrial fibrillation follow-up. Patient's past medical history and cardiovascular risk factors include: Paroxysmal atrial fibrillation, sleep apnea not on CPAP, asthma, anxiety, benign essential hypertension, obesity due to excess calories, GERD.  Patient was originally seen in July 2022 for evaluation of palpitations.  She underwent a Holter monitor which noted paroxysmal atrial fibrillation with a burden of approximately 10%.  Patient had a sooner than scheduled visit via telemedicine during which we discussed the diagnosis and management of paroxysmal atrial fibrillation.  At that time the shared decision was to proceed with rate control strategy and patient was started on Toprol-XL.  Patient chose not to be on oral anticoagulation despite her CHA2DS2-VASc score.  But she did agree to start aspirin 81 mg p.o. daily.  She now presents for follow-up.  At last visit, was increased to 50 mg daily.  However, according to patient her prescription remained for 25 mg for 3 days last week she has been taking 70 mg visit.  Patient states that she is doing well from a cardiovascular standpoint.  She denies any chest pain, palpitations, shortness of breath or volume overload.  FUNCTIONAL STATUS: No structured exercise program or daily routine.   ALLERGIES: Allergies  Allergen Reactions   Cortisone Other (See Comments)    Caused pain and lasted 7 months or more--was told it crystalizes in some people and she may be one who is allergic to the medication  DOES NOT EVER WANT THIS MEDICATION  AGAIN   Hydrocodone Itching   Lisinopril Other (See Comments)   Oxycodone-Acetaminophen Nausea Only   Pantoprazole Other (See Comments)   Rofecoxib     REACTION: asthma like symptoms   Tramadol Other (See Comments)    MEDICATION LIST PRIOR TO VISIT: Current Meds  Medication Sig   acetaminophen (TYLENOL) 650 MG CR tablet Take 650 mg by mouth every 8 (eight) hours as needed for pain.   albuterol (PROVENTIL HFA;VENTOLIN HFA) 108 (90 BASE) MCG/ACT inhaler Inhale 2 puffs into the lungs every 6 (six) hours as needed for wheezing or shortness of breath.    albuterol (PROVENTIL) (2.5 MG/3ML) 0.083% nebulizer solution Take 2.5 mg by nebulization every 6 (six) hours as needed for wheezing or shortness of breath.    ALPRAZolam (XANAX) 0.5 MG tablet Take 1 tablet by mouth as needed. Reported on 11/05/2015   Azelastine HCl 0.15 % SOLN Take 1 tablet by mouth daily.   cetirizine (ZYRTEC) 10 MG tablet Take 10 mg by mouth daily as needed for allergies.   diclofenac Sodium (VOLTAREN) 1 % GEL Apply topically as needed.   famotidine (PEPCID) 40 MG tablet Take 40 mg by mouth at bedtime.   fluocinonide ointment (LIDEX) 8.59 % Apply 1 Application topically 2 (two) times daily as needed.   guaiFENesin-codeine (ROBITUSSIN AC) 100-10 MG/5ML syrup Take 5 mLs by mouth 3 (three) times daily as needed for cough.   ibuprofen (ADVIL) 800 MG tablet Take 800 mg by mouth 3 (three) times daily as needed for mild pain.   levonorgestrel-ethinyl estradiol (SEASONALE,INTROVALE,JOLESSA) 0.15-0.03 MG tablet  Take 1 tablet by mouth daily.   oxyCODONE (OXY IR/ROXICODONE) 5 MG immediate release tablet Take 5 mg by mouth 4 (four) times daily as needed.   Respiratory Therapy Supplies (AIRS DISPOSABLE NEBULIZER) KIT    valsartan-hydrochlorothiazide (DIOVAN-HCT) 160-25 MG per tablet Take 1 tablet by mouth daily.    [DISCONTINUED] CVS ASPIRIN LOW DOSE 81 MG tablet TAKE 1 WHOLE TABLET BY MOUTH DAILY   [DISCONTINUED] metoprolol succinate  (TOPROL-XL) 25 MG 24 hr tablet TAKE 1 TABLET BY MOUTH EVERY DAY IN THE MORNING     PAST MEDICAL HISTORY: Past Medical History:  Diagnosis Date   Anxiety and depression    Arthritis    knee pain-uses crutches at times in house   Asthma    as child and occ   Back pain    Hx of migraines    Hypertension    Obesity    PONV (postoperative nausea and vomiting)    Sleep apnea    mild-no cpap needed    PAST SURGICAL HISTORY: Past Surgical History:  Procedure Laterality Date   CARPAL TUNNEL RELEASE  03/09/2012   Procedure: CARPAL TUNNEL RELEASE;  Surgeon: Wynonia Sours, MD;  Location: [place];  Service: Orthopedics;  Laterality: Right;   CESAREAN SECTION     CHOLECYSTECTOMY     COLONOSCOPY WITH PROPOFOL N/A 09/22/2018   Procedure: COLONOSCOPY WITH PROPOFOL;  Surgeon: Arta Silence, MD;  Location: [facility];  Service: Endoscopy;  Laterality: N/A;   HERNIA REPAIR     POLYPECTOMY  09/22/2018   Procedure: POLYPECTOMY;  Surgeon: Arta Silence, MD;  Location: [facility];  Service: Endoscopy;;   SUBMUCOSAL INJECTION  09/22/2018   Procedure: SUBMUCOSAL INJECTION;  Surgeon: Arta Silence, MD;  Location: [facility];  Service: Endoscopy;;  Epinepherine    FAMILY HISTORY: The patient family history includes Cancer in her father; Hypertension in her maternal aunt, maternal grandmother, mother, and paternal aunt.  SOCIAL HISTORY:  The patient  reports that she has never smoked. She has never used smokeless tobacco. She reports current alcohol use. She reports that she does not use drugs.  REVIEW OF SYSTEMS: Review of Systems  Constitutional: Negative for chills and fever.  HENT:  Negative for hoarse voice and nosebleeds.   Eyes:  Negative for discharge, double vision and pain.  Cardiovascular:  Negative for chest pain, claudication, dyspnea on exertion, leg swelling, near-syncope, orthopnea, palpitations, paroxysmal nocturnal dyspnea and syncope.  Respiratory:   Negative for hemoptysis and shortness of breath.   Musculoskeletal:  Negative for muscle cramps and myalgias.  Gastrointestinal:  Negative for abdominal pain, constipation, diarrhea, hematemesis, hematochezia, melena, nausea and vomiting.  Neurological:  Negative for excessive daytime sleepiness and headaches.    PHYSICAL EXAM:    08/27/2022   11:06 AM 08/27/2022   10:47 AM 06/13/2021    3:50 PM  Vitals with BMI  Height  _0  _1   Weight  291 lbs 284 lbs  BMI  83.15 17.61  Systolic 607 371 062  Diastolic 80 81 87  Pulse 77 71 89    Physical Exam Cardiovascular:     Rate and Rhythm: Normal rate and regular rhythm.     Pulses: Normal pulses.          Carotid pulses are 2+ on the right side and 2+ on the left side.      Radial pulses are 2+ on the right side and 2+ on the left side.       Dorsalis pedis  pulses are 2+ on the right side and 2+ on the left side.       Posterior tibial pulses are 2+ on the right side and 2+ on the left side.     Heart sounds: Normal heart sounds. No murmur heard.    No gallop.  Pulmonary:     Effort: Pulmonary effort is normal.     Breath sounds: Normal breath sounds. No wheezing or rales.  Musculoskeletal:     Right lower leg: No edema.     Left lower leg: No edema.  Neurological:     Mental Status: She is alert.     CARDIAC DATABASE: EKG 08/27/2022: Normal sinus rhythm rate of 65 bpm. Normal axis. Left atrial enlargement. No evidence of ischemia or underlying injury pattern. Compared to previous EKG on 06/13/2022, no significant change.  Echocardiogram: 05/02/2021: Left ventricle cavity is normal in size and wall thickness. Normal global wall motion. Normal LV systolic function with EF 66%. Normal diastolic filling pattern. Mild tricuspid regurgitation No evidence of pulmonary hypertension.  Stress Testing:  Modified Bruce/Lxiscan Sestamibi stress test 07/15/2021: Lexiscan/modified Bruce nuclear stress test performed using 1-day protocol.  Patient achieved 1.0 METS and reached 84% MPHR. Stress EKG at 84% MPHR showed sinus tachycardia, no significant ST-T changes. Normal myocardial perfusion. Stress LVEF 63%. Low risk study.  Heart Catheterization: None  7 day extended Holter monitor: 04/2021 Dominant rhythm normal sinus rhythm, followed by atrial fibrillation. Normal Sinus heart rate 55-138 bpm.  Avg HR 80 bpm. Atrial fibrillation heart rate 67 - 233 bpm, average heart rate 112 bpm. Atrial fibrillation burden 10% (9 hours 7 minutes) No ventricular tachycardia, high grade AV block, pauses (3 seconds or longer). Total ventricular ectopic burden <1%. Total supraventricular ectopic burden <1%. Patient triggered events: 18.  Underlying rhythm predominantly sinus followed by sinus tachycardia.  She had episodes of symptomatic atrial fibrillation (patient reported fluttering/racing on 05/07/2021).  Calcium scoring: 06/12/2021: Total coronary calcium score: 0  LABORATORY DATA:    Latest Ref Rng & Units 09/23/2018    4:27 AM 09/22/2018    9:30 AM 03/09/2012    8:29 AM  CBC  WBC 4.0 - 10.5 K/uL 7.8  7.1    Hemoglobin 12.0 - 15.0 g/dL 12.0  12.5  13.4   Hematocrit 36.0 - 46.0 % 37.5  39.0    Platelets 150 - 400 K/uL 289  283         Latest Ref Rng & Units 04/30/2021    4:11 PM 09/23/2018    4:27 AM 03/04/2012    1:30 PM  CMP  Glucose 65 - 99 mg/dL 83  103  91   BUN 6 - 24 mg/dL _0 Creatinine 0.57 - 1.00 mg/dL 0.95  0.77  0.85   Sodium 134 - 144 mmol/L 140  141  138   Potassium 3.5 - 5.2 mmol/L 4.6  3.2  3.9   Chloride 96 - 106 mmol/L 102  106  102   CO2 20 - 29 mmol/L _1 Calcium 8.7 - 10.2 mg/dL 11.1  9.1  9.5   Total Protein 6.0 - 8.5 g/dL 7.6     Total Bilirubin 0.0 - 1.2 mg/dL 0.3     Alkaline Phos 44 - 121 IU/L 76     AST 0 - 40 IU/L 67     ALT 0 - 32 IU/L 142       Lipid Panel  No  results found for: "CHOL", "TRIG", "HDL", "CHOLHDL", "VLDL", "LDLCALC", "LDLDIRECT", "LABVLDL"  No components  found for: "NTPROBNP" No results for input(s): "PROBNP" in the last 8760 hours. No results for input(s): "TSH" in the last 8760 hours.   BMP No results for input(s): "NA", "K", "CL", "CO2", "GLUCOSE", "BUN", "CREATININE", "CALCIUM", "GFRNONAA", "GFRAA" in the last 8760 hours.   HEMOGLOBIN A1C No results found for: "HGBA1C", "MPG"  External Labs: Collected: 07/04/2020 Creatinine 0.82 mg/dL. eGFR: >60 mL/min per 1.73 m Lipid profile: Total cholesterol 165, triglycerides 56, HDL 56, LDL 98, non HDL 109 Hemoglobin 13.8 g/dL, hematocrit 41.1% AST 17, ALT 18, alkaline phosphatase 55.  IMPRESSION:    ICD-10-CM   1. Benign hypertension  I10 EKG 12-Lead    2. Paroxysmal A-fib (HCC)  I48.0 aspirin EC ([facility]S ASPIRIN LOW DOSE) 81 MG tablet    metoprolol succinate (TOPROL-XL) 25 MG 24 hr tablet    Ambulatory referral to Sleep Studies    3. Palpitations  R00.2 metoprolol succinate (TOPROL-XL) 25 MG 24 hr tablet       RECOMMENDATIONS: Alexis Cook is a 56 y.o. female whose past medical history and cardiac risk factors include: Paroxysmal atrial fibrillation, sleep apnea not on CPAP, asthma, anxiety, benign essential hypertension, obesity due to excess calories, GERD.  Paroxysmal atrial fibrillation: Recently discovered in July 2022. Rate control: Toprol 23m daily At last visit plan was to increase metoprolol to 50 mg daily however patient has remained on 25 mg daily and is overall doing well with no recurrence of A-fib.  Therefore we will continue Toprol 25 mg daily. Rhythm control: N/A  Thromboembolic prophylaxis: Aspirin CHA2DS2-VASc SCORE is 2 which correlates to 2.2 % risk of stroke per year (gender, hypertension). Discussed the role of anticoagulation with regards to thromboembolic prophylaxis and also risks as well as alternatives. Patient chooses to be on aspirin 81 mg p.o. daily for now. At last visit it was recommended that she follow-up with PCP and have another sleep  study and if she continues to have sleep apnea she should be treated accordingly.  She has not yet followed up for another sleep study and is requesting referral for this.  Place referral to ambulatory sleep studies. Educated on the importance of improving her modifiable cardiovascular risk factors including blood pressure, addressing sleep apnea, increasing physical activity as tolerated with a goal of 30 minutes a day 5 days a week. Reviewed results of echo and clear exercise stress test with patient, overall low risk study. EKG shows normal sinus rhythm without underlying ischemia  Benign essential hypertension: Well controlled, continue current medications without changes. Low-salt diet recommended Currently managed by primary care provider.  Obesity, due to excess calories: Body mass index is 51.55 kg/m. I reviewed with the patient the importance of diet, regular physical activity/exercise, weight loss.   Patient is educated on increasing physical activity gradually as tolerated.  With the goal of moderate intensity exercise for 30 minutes a day 5 days a week.   FINAL MEDICATION LIST END OF ENCOUNTER: Meds ordered this encounter  Medications   aspirin EC (CVS ASPIRIN LOW DOSE) 81 MG tablet    Sig: TAKE 1 WHOLE TABLET BY MOUTH DAILY    Dispense:  90 tablet    Refill:  3    Order Specific Question:   Supervising Provider    Answer:   GAdrian Prows[[id number]]   metoprolol succinate (TOPROL-XL) 25 MG 24 hr tablet    Sig: TAKE 1 TABLET BY MOUTH EVERY DAY  IN THE MORNING    Dispense:  90 tablet    Refill:  3    Order Specific Question:   Supervising Provider    Answer:   Adrian Prows [[id number]]     Medications Discontinued During This Encounter  Medication Reason   CVS ASPIRIN LOW DOSE 81 MG tablet Reorder   metoprolol succinate (TOPROL-XL) 25 MG 24 hr tablet Reorder      Current Outpatient Medications:    acetaminophen (TYLENOL) 650 MG CR tablet, Take 650 mg by mouth every 8 (eight) hours  as needed for pain., Disp: , Rfl:    albuterol (PROVENTIL HFA;VENTOLIN HFA) 108 (90 BASE) MCG/ACT inhaler, Inhale 2 puffs into the lungs every 6 (six) hours as needed for wheezing or shortness of breath. , Disp: , Rfl:    albuterol (PROVENTIL) (2.5 MG/3ML) 0.083% nebulizer solution, Take 2.5 mg by nebulization every 6 (six) hours as needed for wheezing or shortness of breath. , Disp: , Rfl:    ALPRAZolam (XANAX) 0.5 MG tablet, Take 1 tablet by mouth as needed. Reported on 11/05/2015, Disp: , Rfl:    Azelastine HCl 0.15 % SOLN, Take 1 tablet by mouth daily., Disp: , Rfl:    cetirizine (ZYRTEC) 10 MG tablet, Take 10 mg by mouth daily as needed for allergies., Disp: , Rfl:    diclofenac Sodium (VOLTAREN) 1 % GEL, Apply topically as needed., Disp: , Rfl:    famotidine (PEPCID) 40 MG tablet, Take 40 mg by mouth at bedtime., Disp: , Rfl:    fluocinonide ointment (LIDEX) 1.01 %, Apply 1 Application topically 2 (two) times daily as needed., Disp: , Rfl:    guaiFENesin-codeine (ROBITUSSIN AC) 100-10 MG/5ML syrup, Take 5 mLs by mouth 3 (three) times daily as needed for cough., Disp: , Rfl:    ibuprofen (ADVIL) 800 MG tablet, Take 800 mg by mouth 3 (three) times daily as needed for mild pain., Disp: , Rfl:    levonorgestrel-ethinyl estradiol (SEASONALE,INTROVALE,JOLESSA) 0.15-0.03 MG tablet, Take 1 tablet by mouth daily., Disp: , Rfl:    oxyCODONE (OXY IR/ROXICODONE) 5 MG immediate release tablet, Take 5 mg by mouth 4 (four) times daily as needed., Disp: , Rfl:    Respiratory Therapy Supplies (AIRS DISPOSABLE NEBULIZER) KIT, , Disp: , Rfl: 3   valsartan-hydrochlorothiazide (DIOVAN-HCT) 160-25 MG per tablet, Take 1 tablet by mouth daily. , Disp: , Rfl:    aspirin EC (CVS ASPIRIN LOW DOSE) 81 MG tablet, TAKE 1 WHOLE TABLET BY MOUTH DAILY, Disp: 90 tablet, Rfl: 3   metoprolol succinate (TOPROL-XL) 25 MG 24 hr tablet, TAKE 1 TABLET BY MOUTH EVERY DAY IN THE MORNING, Disp: 90 tablet, Rfl: 3  Orders Placed This  Encounter  Procedures   Ambulatory referral to Sleep Studies   EKG 12-Lead    There are no Patient Instructions on file for this visit.   --Continue cardiac medications as reconciled in final medication list. --Return in about 1 year (around 08/28/2023) for AF, HTN. Or sooner if needed. --Continue follow-up with your primary care physician regarding the management of your other chronic comorbid conditions.  Patient's questions and concerns were addressed to her satisfaction. She voices understanding of the instructions provided during this encounter.   This note was created using a voice recognition software as a result there may be grammatical errors inadvertently enclosed that do not reflect the nature of this encounter. Every attempt is made to correct such errors.    Ernst Spell, [place] Pager: [phone] Office: [phone]

## 2022-08-30 IMAGING — US US EXTREM LOW VENOUS*L*
1 series · 14 of 24 positions shown · non-contrast
Comparison: None.

CLINICAL DATA: Left calf pain

EXAM:
LEFT LOWER EXTREMITY VENOUS DOPPLER ULTRASOUND
TECHNIQUE: Gray-scale sonography with compression, as well as color and duplex
ultrasound, were performed to evaluate the deep venous system(s)
from the level of the common femoral vein through the popliteal and
proximal calf veins.

[Series 1: us venous img lower uni left (dvt) · portal-venous · 14 of 43 slices shown]
[im 1/43]
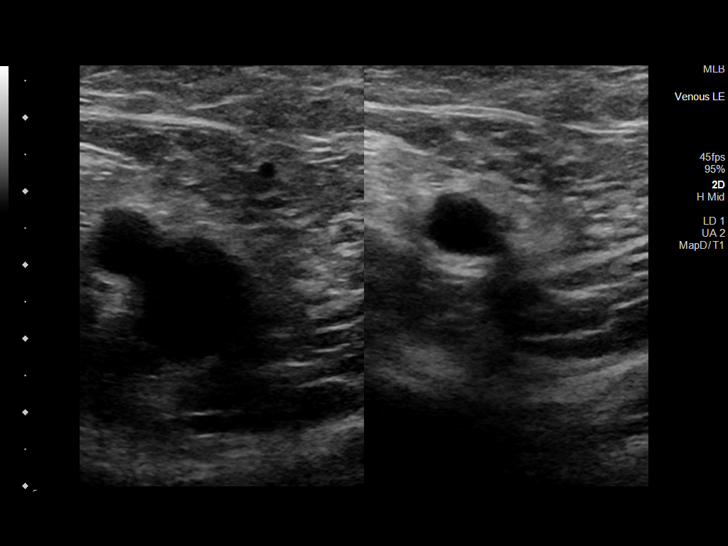
[im 4/43]
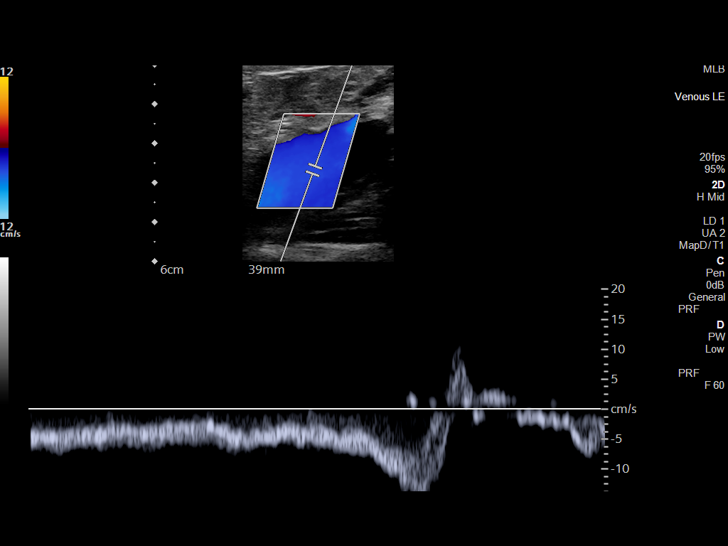
[im 8/43]
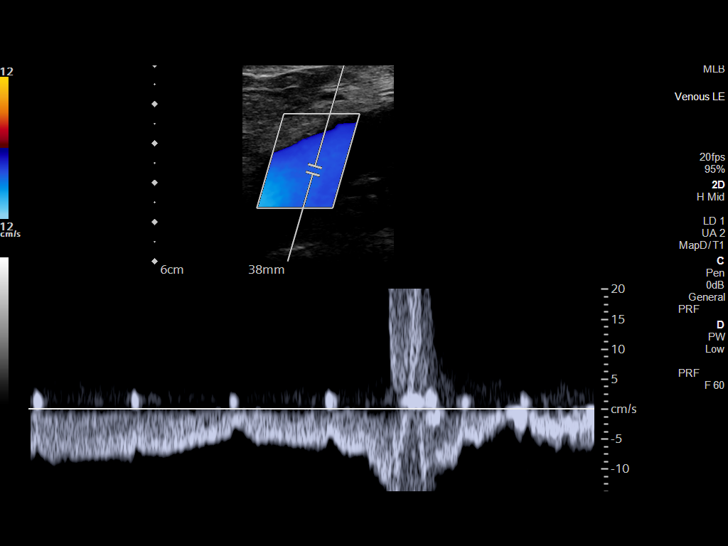
[im 11/43]
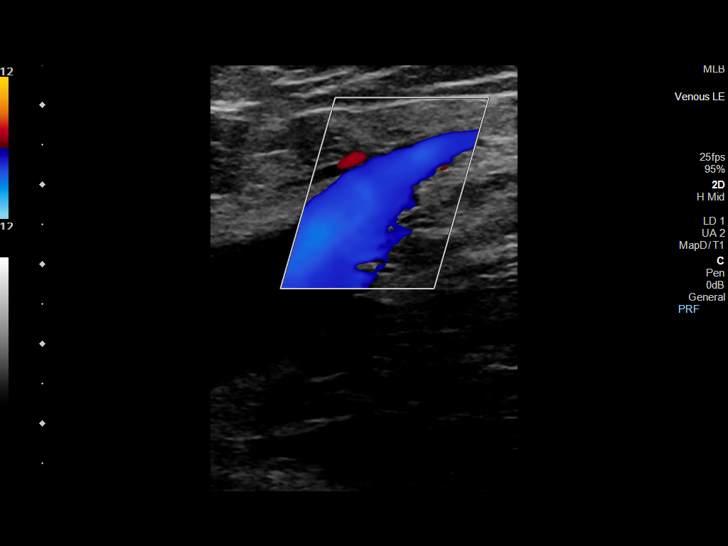
[im 13/43]
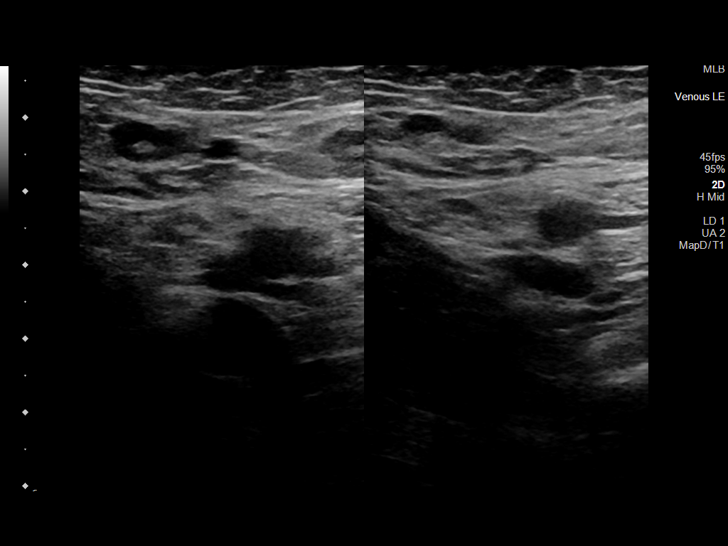
[im 17/43]
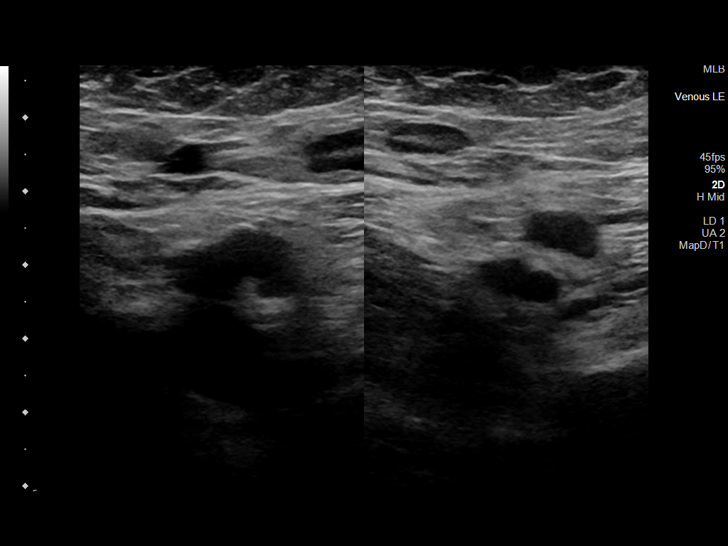
[im 21/43]
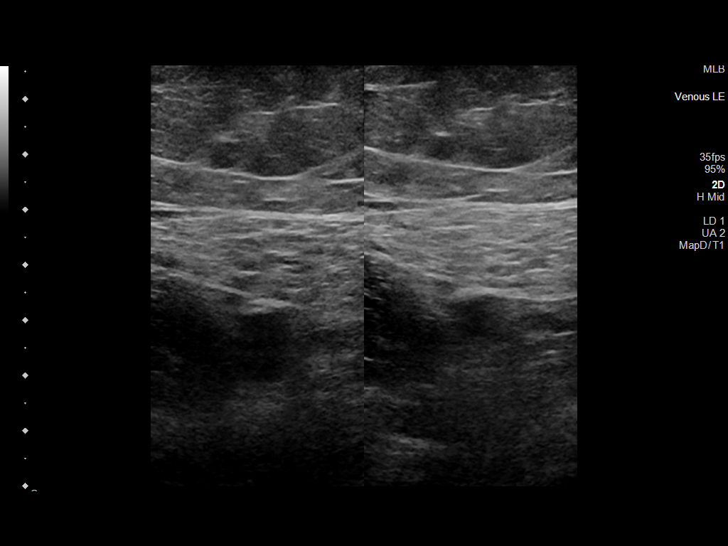
[im 22/43]
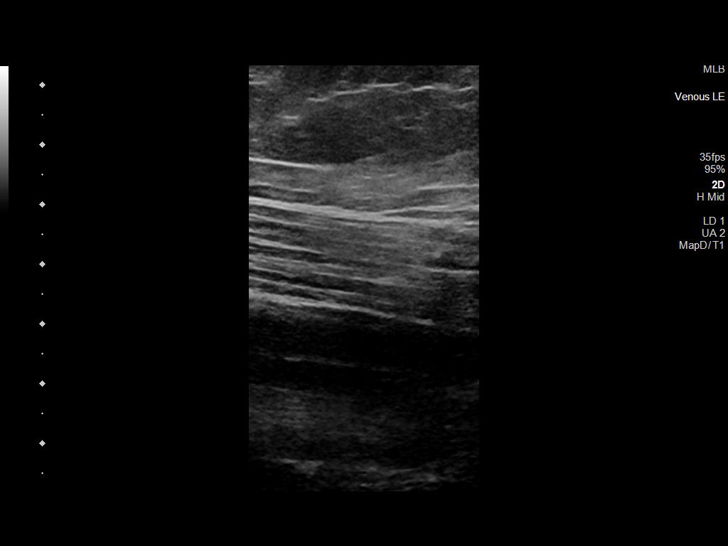
[im 26/43]
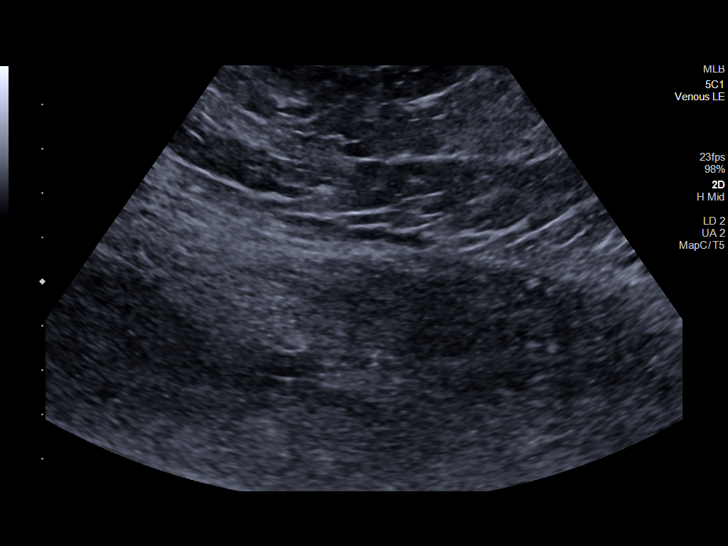
[im 30/43]
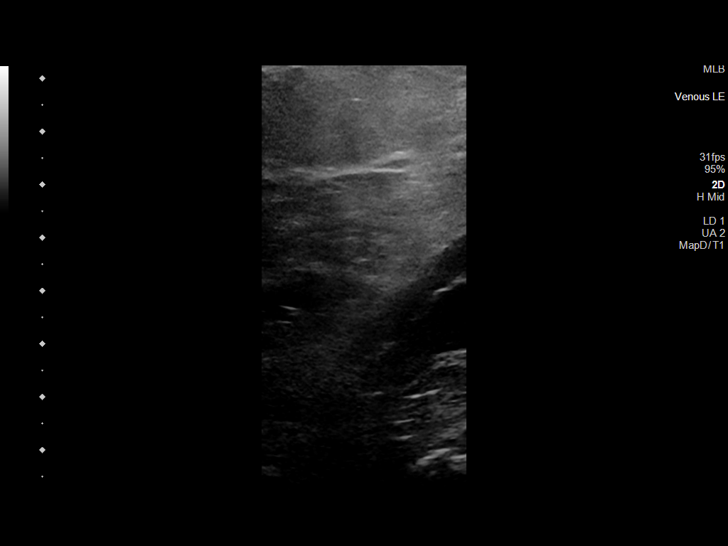
[im 33/43]
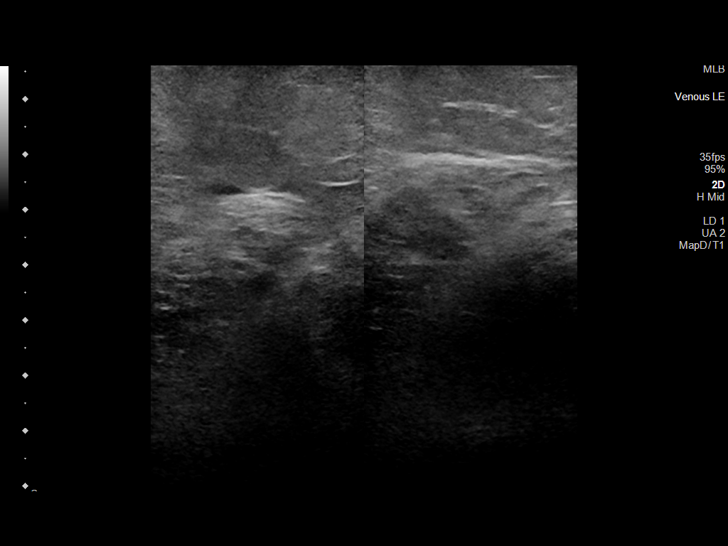
[im 35/43]
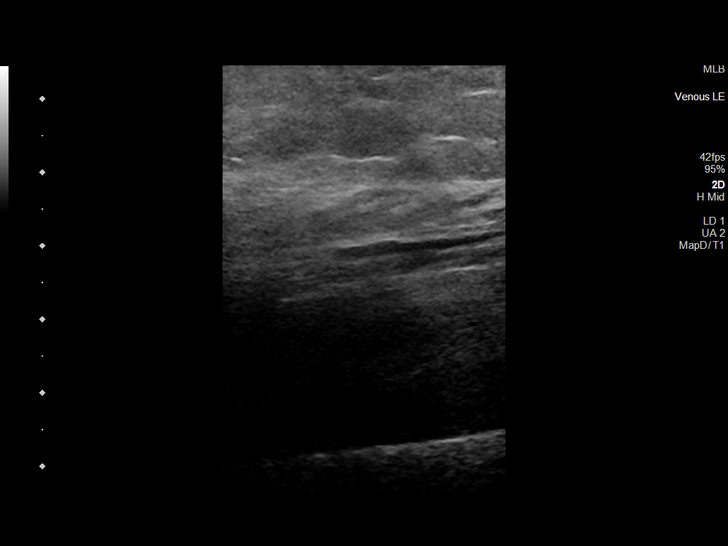
[im 39/43]
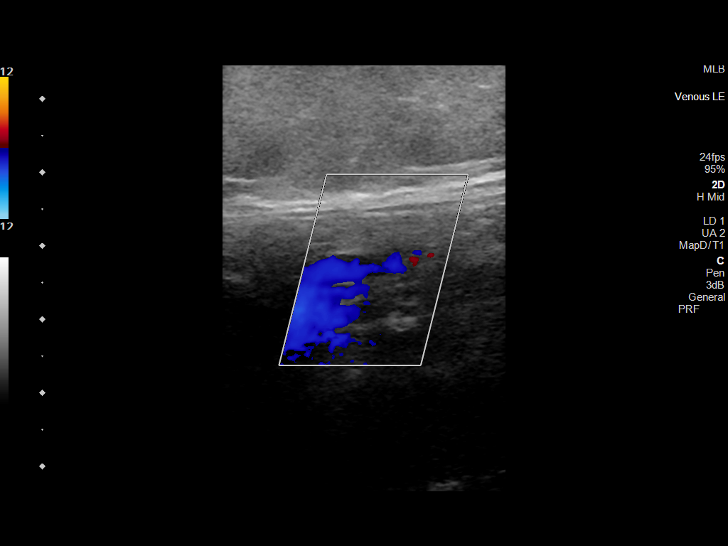
[im 43/43]
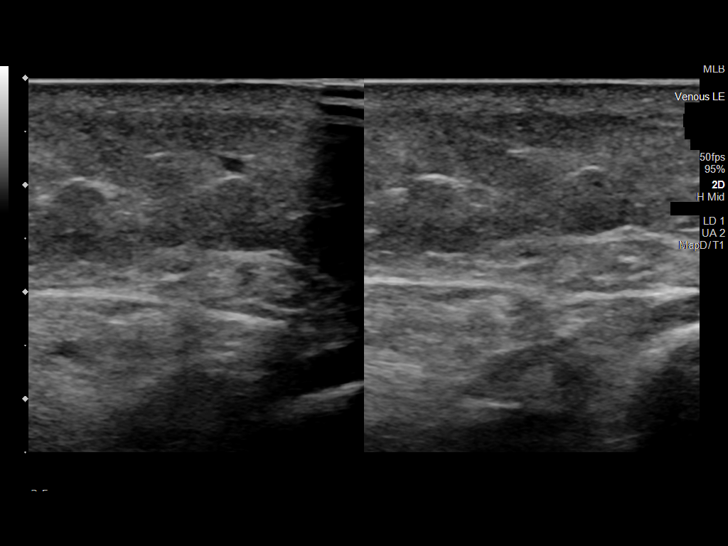

[14 of 24 positions shown; findings below may reference images not displayed]

FINDINGS: VENOUS

Normal compressibility of the common femoral, superficial femoral,
and popliteal veins, as well as the visualized calf veins.
Visualized portions of profunda femoral vein and great saphenous
vein unremarkable. No filling defects to suggest DVT on grayscale or
color Doppler imaging. Doppler waveforms show normal direction of
venous flow, normal respiratory plasticity and response to
augmentation.

Limited views of the contralateral common femoral vein are
unremarkable.

OTHER

None.

Limitations: Difficult to visualize the calf veins secondary to
patient body habitus. The peroneal veins are not particularly well
seen.
IMPRESSION: No evidence of acute deep venous thrombosis to the level of the
knee, or within the posterior tibial veins of the calf. Poor
visualization of the peroneal veins.

## 2022-09-16 ENCOUNTER — Other Ambulatory Visit: Payer: Self-pay

## 2022-09-16 ENCOUNTER — Encounter (HOSPITAL_COMMUNITY): Payer: Self-pay | Admitting: Gastroenterology

## 2022-09-19 ENCOUNTER — Other Ambulatory Visit: Payer: Self-pay | Admitting: Gastroenterology

## 2022-09-23 NOTE — Anesthesia Preprocedure Evaluation (Addendum)
Anesthesia Evaluation  Patient identified by MRN, date of birth, ID band Patient awake    Reviewed: Allergy & Precautions, NPO status , Patient's Chart, lab work & pertinent test results, reviewed documented beta blocker date and time   History of Anesthesia Complications (+) PONV and history of anesthetic complications  Airway Mallampati: I  TM Distance: >3 FB Neck ROM: Full    Dental  (+) Dental Advisory Given, Teeth Intact   Pulmonary asthma , sleep apnea    Pulmonary exam normal        Cardiovascular hypertension, Pt. on medications and Pt. on home beta blockers Normal cardiovascular exam+ dysrhythmias Atrial Fibrillation    '22 Myoperfusion - Normal myocardial perfusion. Stress LVEF 63%. Low risk study.    Neuro/Psych  PSYCHIATRIC DISORDERS Anxiety Depression    negative neurological ROS     GI/Hepatic Neg liver ROS,GERD  Controlled and Medicated,,  Endo/Other  negative endocrine ROS    Renal/GU negative Renal ROS     Musculoskeletal  (+) Arthritis ,    Abdominal  (+) + obese  Peds  Hematology negative hematology ROS (+)   Anesthesia Other Findings   Reproductive/Obstetrics                             Anesthesia Physical Anesthesia Plan  ASA: 3  Anesthesia Plan: MAC   Post-op Pain Management: Minimal or no pain anticipated   Induction:   PONV Risk Score and Plan: 3 and Propofol infusion and Treatment may vary due to age or medical condition  Airway Management Planned: Nasal Cannula and Natural Airway  Additional Equipment: None  Intra-op Plan:   Post-operative Plan:   Informed Consent: I have reviewed the patients History and Physical, chart, labs and discussed the procedure including the risks, benefits and alternatives for the proposed anesthesia with the patient or authorized representative who has indicated his/her understanding and acceptance.       Plan  Discussed with: CRNA and Anesthesiologist  Anesthesia Plan Comments:        Anesthesia Quick Evaluation

## 2022-09-24 ENCOUNTER — Ambulatory Visit (HOSPITAL_BASED_OUTPATIENT_CLINIC_OR_DEPARTMENT_OTHER): Payer: 59 | Admitting: Certified Registered"

## 2022-09-24 ENCOUNTER — Encounter (HOSPITAL_COMMUNITY): Payer: Self-pay | Admitting: Gastroenterology

## 2022-09-24 ENCOUNTER — Ambulatory Visit (HOSPITAL_COMMUNITY): Payer: 59 | Admitting: Certified Registered"

## 2022-09-24 ENCOUNTER — Ambulatory Visit (HOSPITAL_COMMUNITY)
Admission: RE | Admit: 2022-09-24 | Discharge: 2022-09-24 | Disposition: A | Discharge status: Home / Self Care | Payer: 59 | Attending: Gastroenterology | Admitting: Gastroenterology

## 2022-09-24 ENCOUNTER — Encounter (HOSPITAL_COMMUNITY)
Admission: RE | Disposition: A | Discharge status: Home / Self Care | Payer: Self-pay | Source: Home / Self Care | Attending: Gastroenterology

## 2022-09-24 ENCOUNTER — Other Ambulatory Visit: Payer: Self-pay

## 2022-09-24 DIAGNOSIS — Z79899 Other long term (current) drug therapy: Secondary | ICD-10-CM | POA: Insufficient documentation

## 2022-09-24 DIAGNOSIS — G4730 Sleep apnea, unspecified: Secondary | ICD-10-CM

## 2022-09-24 DIAGNOSIS — K5730 Diverticulosis of large intestine without perforation or abscess without bleeding: Secondary | ICD-10-CM | POA: Diagnosis not present

## 2022-09-24 DIAGNOSIS — Z09 Encounter for follow-up examination after completed treatment for conditions other than malignant neoplasm: Secondary | ICD-10-CM | POA: Diagnosis not present

## 2022-09-24 DIAGNOSIS — Z86010 Personal history of colonic polyps: Secondary | ICD-10-CM | POA: Insufficient documentation

## 2022-09-24 DIAGNOSIS — K6289 Other specified diseases of anus and rectum: Secondary | ICD-10-CM | POA: Insufficient documentation

## 2022-09-24 DIAGNOSIS — M1990 Unspecified osteoarthritis, unspecified site: Secondary | ICD-10-CM | POA: Diagnosis not present

## 2022-09-24 DIAGNOSIS — R1310 Dysphagia, unspecified: Secondary | ICD-10-CM | POA: Insufficient documentation

## 2022-09-24 DIAGNOSIS — I4891 Unspecified atrial fibrillation: Secondary | ICD-10-CM | POA: Diagnosis not present

## 2022-09-24 DIAGNOSIS — I10 Essential (primary) hypertension: Secondary | ICD-10-CM | POA: Diagnosis not present

## 2022-09-24 DIAGNOSIS — K2970 Gastritis, unspecified, without bleeding: Secondary | ICD-10-CM | POA: Diagnosis not present

## 2022-09-24 DIAGNOSIS — K219 Gastro-esophageal reflux disease without esophagitis: Secondary | ICD-10-CM | POA: Insufficient documentation

## 2022-09-24 DIAGNOSIS — J45909 Unspecified asthma, uncomplicated: Secondary | ICD-10-CM | POA: Diagnosis not present

## 2022-09-24 HISTORY — PX: BIOPSY: SHX5522

## 2022-09-24 HISTORY — DX: Cardiac arrhythmia, unspecified: I49.9

## 2022-09-24 HISTORY — PX: ESOPHAGOGASTRODUODENOSCOPY: SHX5428

## 2022-09-24 HISTORY — PX: COLONOSCOPY WITH PROPOFOL: SHX5780

## 2022-09-24 HISTORY — DX: Gastro-esophageal reflux disease without esophagitis: K21.9

## 2022-09-24 SURGERY — COLONOSCOPY WITH PROPOFOL
Anesthesia: Monitor Anesthesia Care

## 2022-09-24 SURGERY — ESOPHAGOGASTRODUODENOSCOPY (EGD) WITH PROPOFOL
Anesthesia: Monitor Anesthesia Care | Laterality: Bilateral

## 2022-09-24 MED ORDER — PROPOFOL 10 MG/ML IV BOLUS
INTRAVENOUS | Status: AC
  Filled 2022-09-24: qty 20

## 2022-09-24 MED ORDER — PROPOFOL 500 MG/50ML IV EMUL
INTRAVENOUS | Status: DC | PRN
  Administered 2022-09-24: 150 ug/kg/min via INTRAVENOUS

## 2022-09-24 MED ORDER — METOPROLOL TARTRATE 25 MG PO TABS
25.0000 mg | ORAL_TABLET | Freq: Once | ORAL | Status: AC
  Administered 2022-09-24: 25 mg via ORAL
  Filled 2022-09-24: qty 1

## 2022-09-24 MED ORDER — LIDOCAINE 2% (20 MG/ML) 5 ML SYRINGE
INTRAMUSCULAR | Status: DC | PRN
  Administered 2022-09-24: 100 mg via INTRAVENOUS

## 2022-09-24 MED ORDER — PROPOFOL 10 MG/ML IV BOLUS
INTRAVENOUS | Status: DC | PRN
  Administered 2022-09-24 (×3): 20 mg via INTRAVENOUS

## 2022-09-24 MED ORDER — LACTATED RINGERS IV SOLN: INTRAVENOUS | Status: DC | PRN

## 2022-09-24 MED ORDER — SODIUM CHLORIDE 0.9 % IV SOLN: INTRAVENOUS | Status: DC

## 2022-09-24 SURGICAL SUPPLY — 22 items

## 2022-09-24 NOTE — Discharge Instructions (Signed)

## 2022-09-24 NOTE — Op Note (Signed)
[facility] Patient Name: Alexis Cook Procedure Date: 09/24/2022 MRN: [id number] Attending MD: Arta Silence , MD, [id number] Date of Birth: 1966/04/25 CSN: [id number] Age: 56 Admit Type: Outpatient Procedure:                Upper GI endoscopy Indications:              Dysphagia Providers:                Arta Silence, MD, Jamison Neighbor RN, RN, Hinton Dyer Technician, Technician Referring MD:              Medicines:                Monitored Anesthesia Care Complications:            No immediate complications. Estimated Blood Loss:     Estimated blood loss: none. Procedure:                Pre-Anesthesia Assessment:                           - Prior to the procedure, a History and Physical                            was performed, and patient medications and                            allergies were reviewed. The patient's tolerance of                            previous anesthesia was also reviewed. The risks                            and benefits of the procedure and the sedation                            options and risks were discussed with the patient.                            All questions were answered, and informed consent                            was obtained. Prior Anticoagulants: The patient has                            taken no anticoagulant or antiplatelet agents                            except for aspirin. ASA Grade Assessment: III - A                            patient with severe systemic disease. After  reviewing the risks and benefits, the patient was                            deemed in satisfactory condition to undergo the                            procedure.                           After obtaining informed consent, the endoscope was                            passed under direct vision. Throughout the                            procedure, the patient's blood pressure, pulse, and                             oxygen saturations were monitored continuously. The                            [id number]-[id number] ([id number]) Olympus endoscope was introduced                            through the mouth, and advanced to the second part                            of duodenum. The upper GI endoscopy was                            accomplished without difficulty. The patient                            tolerated the procedure well. Scope In: Scope Out: Findings:      The examined esophagus was normal. Biopsies were obtained from the       proximal and distal esophagus with cold forceps for histology of       suspected eosinophilic esophagitis.      Patchy moderate inflammation was found in the entire examined stomach.       Biopsies were taken with a cold forceps for histology.      The exam of the stomach was otherwise normal.      The duodenal bulb, first portion of the duodenum and second portion of       the duodenum were normal. Impression:               - Normal esophagus. Biopsied to assess EOE. No                            stricture/ring/mass.                           - Gastritis. Biopsied.                           - Normal duodenal bulb, first portion of the  duodenum and second portion of the duodenum.                           - Biopsies were taken with a cold forceps for                            evaluation of eosinophilic esophagitis. Moderate Sedation:      None Recommendation:           - Perform a colonoscopy today. Procedure Code(s):        --- Professional ---                           [phone], Esophagogastroduodenoscopy, flexible,                            transoral; with biopsy, single or multiple Diagnosis Code(s):        --- Professional ---                           K29.70, Gastritis, unspecified, without bleeding                           R13.10, Dysphagia, unspecified CPT copyright 2022 [employer]. All rights  reserved. The codes documented in this report are preliminary and upon coder review may  be revised to meet current compliance requirements. Arta Silence, MD 09/24/2022 10:05:53 AM This report has been signed electronically. Number of Addenda: 0

## 2022-09-24 NOTE — Anesthesia Procedure Notes (Signed)
Procedure Name: MAC Date/Time: 09/24/2022 9:14 AM  Performed by: Eben Burow, CRNAPre-anesthesia Checklist: Patient identified, Emergency Drugs available, Suction available, Patient being monitored and Timeout performed Oxygen Delivery Method: Simple face mask Placement Confirmation: positive ETCO2

## 2022-09-24 NOTE — H&P (Signed)
[facility] H/P Note  Chief Complaint: dysphagia, personal history of colonic polyps  HPI: Alexis Cook is an 56 y.o. female.  Dysphagia, dysmotility on esophagram.  Personal history of colonic polyps.  Past Medical History:  Diagnosis Date   Anxiety and depression    Arthritis    knee pain-uses crutches at times in house   Asthma    as child and occ   Back pain    Dysrhythmia    hx of Afib   GERD (gastroesophageal reflux disease)    Hx of migraines    Hypertension    Obesity    PONV (postoperative nausea and vomiting)    Sleep apnea    mild-no cpap needed    Past Surgical History:  Procedure Laterality Date   CARPAL TUNNEL RELEASE  03/09/2012   Procedure: CARPAL TUNNEL RELEASE;  Surgeon: Wynonia Sours, MD;  Location: [facility];  Service: Orthopedics;  Laterality: Right;   CESAREAN SECTION     CHOLECYSTECTOMY     COLONOSCOPY WITH PROPOFOL N/A 09/22/2018   Procedure: COLONOSCOPY WITH PROPOFOL;  Surgeon: Arta Silence, MD;  Location: [facility];  Service: Endoscopy;  Laterality: N/A;   HERNIA REPAIR     POLYPECTOMY  09/22/2018   Procedure: POLYPECTOMY;  Surgeon: Arta Silence, MD;  Location: [facility];  Service: Endoscopy;;   SUBMUCOSAL INJECTION  09/22/2018   Procedure: SUBMUCOSAL INJECTION;  Surgeon: Arta Silence, MD;  Location: [facility];  Service: Endoscopy;;  Epinepherine    Medications Prior to Admission  Medication Sig Dispense Refill   acetaminophen (TYLENOL) 650 MG CR tablet Take 650 mg by mouth every 8 (eight) hours as needed for pain.     aspirin EC ([employer] ASPIRIN LOW DOSE) 81 MG tablet TAKE 1 WHOLE TABLET BY MOUTH DAILY 90 tablet 3   aspirin EC 81 MG tablet Take 81 mg by mouth daily. Swallow whole.     cetirizine (ZYRTEC) 10 MG tablet Take 10 mg by mouth daily as needed for allergies.     cholecalciferol (VITAMIN D3) 25 MCG (1000 UNIT) tablet Take 1,000 Units by mouth daily.     diclofenac Sodium (VOLTAREN) 1 % GEL Apply  2 g topically as needed (Pain).     famotidine (PEPCID) 40 MG tablet Take 40 mg by mouth daily as needed for heartburn or indigestion.     ibuprofen (ADVIL) 800 MG tablet Take 800 mg by mouth daily.     levonorgestrel-ethinyl estradiol (SEASONALE,INTROVALE,JOLESSA) 0.15-0.03 MG tablet Take 1 tablet by mouth daily.     metoprolol succinate (TOPROL-XL) 25 MG 24 hr tablet TAKE 1 TABLET BY MOUTH EVERY DAY IN THE MORNING 90 tablet 3   Multiple Vitamins-Minerals (MULTIVITAMIN WITH MINERALS) tablet Take 1 tablet by mouth daily. 50+     valsartan-hydrochlorothiazide (DIOVAN-HCT) 160-25 MG per tablet Take 1 tablet by mouth daily.      albuterol (PROVENTIL HFA;VENTOLIN HFA) 108 (90 BASE) MCG/ACT inhaler Inhale 2 puffs into the lungs every 6 (six) hours as needed for wheezing or shortness of breath.      albuterol (PROVENTIL) (2.5 MG/3ML) 0.083% nebulizer solution Take 2.5 mg by nebulization every 6 (six) hours as needed for wheezing or shortness of breath.      fluocinonide ointment (LIDEX) 0.93 % Apply 1 Application topically 2 (two) times daily as needed (Eczema).     guaiFENesin-codeine (ROBITUSSIN AC) 100-10 MG/5ML syrup Take 5 mLs by mouth 3 (three) times daily as needed for cough.     oxyCODONE (OXY  IR/ROXICODONE) 5 MG immediate release tablet Take 5 mg by mouth daily as needed for moderate pain or severe pain.     Respiratory Therapy Supplies (AIRS DISPOSABLE NEBULIZER) KIT   3    Allergies:  Allergies  Allergen Reactions   Cortisone Other (See Comments)    Caused pain and lasted 7 months or more--was told it crystalizes in some people and she may be one who is allergic to the medication  DOES NOT EVER WANT THIS MEDICATION AGAIN   Hydrocodone Itching   Lisinopril Other (See Comments)    Made her tired   Oxycodone-Acetaminophen Nausea Only   Pantoprazole Other (See Comments)    Sores on lips   Rofecoxib     REACTION: asthma like symptoms   Tramadol Other (See Comments)    Family History   Problem Relation Age of Onset   Hypertension Mother    Cancer Father        not sure of what kind   Hypertension Maternal Aunt    Hypertension Paternal Aunt    Hypertension Maternal Grandmother     Social History:  reports that she has never smoked. She has never used smokeless tobacco. She reports current alcohol use. She reports that she does not use drugs.   ROS: As per HPI, all others negative   Blood pressure (!) 185/90, pulse 77, temperature 97.6 F (36.4 C), temperature source Temporal, resp. rate (!) 23, height _0  (1.575 m), weight 131.5 kg, last menstrual period 08/26/2022, SpO2 99 %. General appearance: NAD, overweight HEENT:  [place]/AT, anicteric CV:  Regular RESP:  No distress ABD:  Protuberant, soft, non-tender NEURO:  A/O, no encephalopathy  No results found for this or any previous visit (from the past 48 hour(s)). No results found.  Assessment/Plan   Dysphagia. Personal history of colonic polyps. Esophagogastroduodenoscopy with possible esophageal dilatation. Risks (bleeding, infection, bowel perforation that could require surgery, sedation-related changes in cardiopulmonary systems), benefits (identification and possible treatment of source of symptoms, exclusion of certain causes of symptoms), and alternatives (watchful waiting, radiographic imaging studies, empiric medical treatment) of upper endoscopy with possible dilatation (EGD +- DIL) were explained to patient/family in detail and patient wishes to proceed.  Colonoscopy. Risks (bleeding, infection, bowel perforation that could require surgery, sedation-related changes in cardiopulmonary systems), benefits (identification and possible treatment of source of symptoms, exclusion of certain causes of symptoms), and alternatives (watchful waiting, radiographic imaging studies, empiric medical treatment) of colonoscopy were explained to patient/family in detail and patient wishes to proceed.   Alexis Cook 09/24/2022, 8:46 AM

## 2022-09-24 NOTE — Transfer of Care (Signed)
Immediate Anesthesia Transfer of Care Note  Patient: Alexis Cook  Procedure(s) Performed: COLONOSCOPY WITH PROPOFOL ESOPHAGOGASTRODUODENOSCOPY (EGD)  Patient Location: PACU and Endoscopy Unit  Anesthesia Type:MAC  Level of Consciousness: awake, alert , and patient cooperative  Airway & Oxygen Therapy: Patient Spontanous Breathing  Post-op Assessment: Report given to RN and Post -op Vital signs reviewed and stable  Post vital signs: Reviewed and stable  Last Vitals:  Vitals Value Taken Time  BP 174/97   Temp    Pulse 76   Resp 20   SpO2 100     Last Pain:  Vitals:   09/24/22 0829  TempSrc: Temporal  PainSc: 0-No pain         Complications: No notable events documented.

## 2022-09-24 NOTE — Op Note (Signed)
[facility] Patient Name: Alexis Cook Procedure Date: 09/24/2022 MRN: [id number] Attending MD: Arta Silence , MD, [id number] Date of Birth: [birth year] CSN: [id number] Age: 56 Admit Type: Outpatient Procedure:                Colonoscopy Indications:              High risk colon cancer surveillance: Personal                            history of colonic polyps Providers:                Arta Silence, MD, Jamison Neighbor RN, RN, Hinton Dyer Technician, Technician Referring MD:              Medicines:                Monitored Anesthesia Care Complications:            No immediate complications. Estimated Blood Loss:     Estimated blood loss: none. Estimated blood loss:                            none. Procedure:                Pre-Anesthesia Assessment:                           - Prior to the procedure, a History and Physical                            was performed, and patient medications and                            allergies were reviewed. The patient's tolerance of                            previous anesthesia was also reviewed. The risks                            and benefits of the procedure and the sedation                            options and risks were discussed with the patient.                            All questions were answered, and informed consent                            was obtained. Prior Anticoagulants: The patient has                            taken no anticoagulant or antiplatelet agents                            except for aspirin. ASA  Grade Assessment: III - A                            patient with severe systemic disease. After                            reviewing the risks and benefits, the patient was                            deemed in satisfactory condition to undergo the                            procedure.                           After obtaining informed consent, the colonoscope                             was passed under direct vision. Throughout the                            procedure, the patient's blood pressure, pulse, and                            oxygen saturations were monitored continuously. The                            CF-HQ190L ([id number]) Olympus colonoscope was                            introduced through the anus and advanced to the the                            cecum, identified by appendiceal orifice and                            ileocecal valve. The entire colon was examined. The                            colonoscopy was performed without difficulty. The                            ileocecal valve, appendiceal orifice, and rectum                            were photographed. The quality of the bowel                            preparation was fair. Scope In: 9:38:25 AM Scope Out: 9:50:12 AM Scope Withdrawal Time: 0 hours 8 minutes 35 seconds  Total Procedure Duration: 0 hours 11 minutes 47 seconds  Findings:      The perianal and digital rectal examinations were normal.      Scattered medium-mouthed diverticula were found in the sigmoid colon and       transverse colon.  Colon otherwise normal; no other polyps, masses, vascular ectasias, or       inflammatory changes were seen.      A localized area of moderately nodular mucosa was found at the anus.      The exam was otherwise without abnormality. Impression:               - Diverticulosis in the sigmoid colon and in the                            transverse colon.                           - Nodular mucosa at the anus.                           - The examination was otherwise normal.                           - No specimens collected.                           - The examination was otherwise normal. Moderate Sedation:      None Recommendation:           - Patient has a contact number available for                            emergencies. The signs and symptoms of potential                             delayed complications were discussed with the                            patient. Return to normal activities tomorrow.                            Written discharge instructions were provided to the                            patient.                           - Discharge patient to home (ambulatory).                           - High fiber diet indefinitely.                           - Continue present medications.                           - Repeat colonoscopy in 5 years for surveillance.                           - Refer to a colo-rectal surgeon at appointment to  be scheduled. Evaluate anorectal nodularity                            (suspect benign).                           - Return to GI clinic after studies are complete.                           - Return to referring physician as previously                            scheduled. Procedure Code(s):        --- Professional ---                           [phone], Colonoscopy, flexible; diagnostic, including                            collection of specimen(s) by brushing or washing,                            when performed (separate procedure) Diagnosis Code(s):        --- Professional ---                           Z86.010, Personal history of colonic polyps                           K62.89, Other specified diseases of anus and rectum                           K57.30, Diverticulosis of large intestine without                            perforation or abscess without bleeding CPT copyright 2022 [employer]. All rights reserved. The codes documented in this report are preliminary and upon coder review may  be revised to meet current compliance requirements. Arta Silence, MD 09/24/2022 10:10:01 AM This report has been signed electronically. Number of Addenda: 0

## 2022-09-24 NOTE — Anesthesia Postprocedure Evaluation (Signed)
Anesthesia Post Note  Patient: Alexis Cook  Procedure(s) Performed: COLONOSCOPY WITH PROPOFOL ESOPHAGOGASTRODUODENOSCOPY (EGD)     Patient location during evaluation: PACU Anesthesia Type: MAC Level of consciousness: awake and alert Pain management: pain level controlled Vital Signs Assessment: post-procedure vital signs reviewed and stable Respiratory status: spontaneous breathing, nonlabored ventilation and respiratory function stable Cardiovascular status: blood pressure returned to baseline Anesthetic complications: no   No notable events documented.  Last Vitals:  Vitals:   09/24/22 1020 09/24/22 1030  BP: (!) 176/101 (!) 180/105  Pulse: 63 (!) 59  Resp: (!) 23 (!) 21  Temp:    SpO2: 98% 100%    Last Pain:  Vitals:   09/24/22 1030  TempSrc:   PainSc: 0-No pain                 Audry Pili

## 2022-09-25 ENCOUNTER — Encounter (HOSPITAL_COMMUNITY): Payer: Self-pay | Admitting: Gastroenterology

## 2022-09-25 LAB — SURGICAL PATHOLOGY

## 2022-10-10 NOTE — Progress Notes (Signed)
Left a message for patient on home phone regarding a message we received from Dr. Erlinda Hong assistant regarding numbness and tingling in her arm from her IV on 09/24/22. I left message that patient could call us back, I stated that if patient had any swelling or redness in her arm, or if she felt like it was not getting better  she should call her primary physician to have it looked at or to consult them about it.

## 2022-12-22 ENCOUNTER — Ambulatory Visit: Payer: 59 | Admitting: Neurology

## 2022-12-22 ENCOUNTER — Encounter: Payer: Self-pay | Admitting: Neurology

## 2022-12-22 VITALS — BP 143/85 | HR 71 | Ht 63.0 in | Wt 294.8 lb

## 2022-12-22 DIAGNOSIS — Z8669 Personal history of other diseases of the nervous system and sense organs: Secondary | ICD-10-CM | POA: Diagnosis not present

## 2022-12-22 DIAGNOSIS — R351 Nocturia: Secondary | ICD-10-CM | POA: Diagnosis not present

## 2022-12-22 DIAGNOSIS — I480 Paroxysmal atrial fibrillation: Secondary | ICD-10-CM

## 2022-12-22 DIAGNOSIS — R635 Abnormal weight gain: Secondary | ICD-10-CM

## 2022-12-22 NOTE — Progress Notes (Signed)
Subjective:    Patient ID: Alexis Cook is a 57 y.o. female.  HPI    Star Age, MD, PhD [facility] [address] [place], [place] [address]  Dear Marye Round,  I saw your patient, Alexis Cook, upon your kind request in my sleep clinic today for initial consultation of her sleep disorder, in particular, concern for underlying obstructive sleep apnea.  The patient is unaccompanied today.  As you know, Alexis Cook is a 57 year old female with an underlying medical history of migraine headaches, vertigo, tinnitus (seen by my colleague, Dr. Leta Baptist), anxiety, depression, arthritis, asthma, back pain, hypertension, palpitations, paroxysmal A-fib, and severe obesity with a BMI of over 50, who was previously diagnosed with obstructive sleep apnea but is currently not on PAP therapy.  Her Epworth sleepiness score is 6 out of 24, fatigue severity score is 17 out of 63. I reviewed your office note from 08/27/2022.  Prior sleep study results are not available for my review today.  As per her recollection, sleep testing was over 10 years ago and she had mild sleep apnea at the time.  She had testing at the [facility] sleep lab.  She does report weight gain over time.  She has arthritis and has restless sleep, does not typically sleep in the same bedroom with her husband as he is also a restless sleeper.  Her bedtime is generally around 930 or 10 PM but she does watch something on her phone.  She may be asleep somewhere around 11 PM on most nights.  Rise time is 4:45 AM.  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  She lives with her husband, she has a grown son age 26.  She currently does not have any pets in her household, her cat is at her son's place.  She drinks caffeine in limitation, typically 2 cups of coffee in the morning, occasional soda, very occasional tea.  She denies recurrent nocturnal or morning headaches.  She does have nasal  congestion and postnasal drip at times.  She has nocturia about once or twice per average night.  She has no first-degree relative with sleep apnea but a cousin on mom's side with sleep apnea.  Her Past Medical History Is Significant For: Past Medical History:  Diagnosis Date   Anxiety and depression    Arthritis    knee pain-uses crutches at times in house   Asthma    as child and occ   Back pain    Dysrhythmia    hx of Afib   GERD (gastroesophageal reflux disease)    Hx of migraines    Hypertension    Obesity    PONV (postoperative nausea and vomiting)    Sleep apnea    mild-no cpap needed    Her Past Surgical History Is Significant For: Past Surgical History:  Procedure Laterality Date   BIOPSY  09/24/2022   Procedure: BIOPSY;  Surgeon: Arta Silence, MD;  Location: [facility];  Service: Gastroenterology;;   Wilmon Pali RELEASE  03/09/2012   Procedure: CARPAL TUNNEL RELEASE;  Surgeon: Wynonia Sours, MD;  Location: [facility];  Service: Orthopedics;  Laterality: Right;   CESAREAN SECTION     CHOLECYSTECTOMY     COLONOSCOPY WITH PROPOFOL N/A 09/22/2018   Procedure: COLONOSCOPY WITH PROPOFOL;  Surgeon: Arta Silence, MD;  Location: [facility];  Service: Endoscopy;  Laterality: N/A;   COLONOSCOPY WITH PROPOFOL N/A 09/24/2022   Procedure: COLONOSCOPY  WITH PROPOFOL;  Surgeon: Arta Silence, MD;  Location: [facility];  Service: Gastroenterology;  Laterality: N/A;   ESOPHAGOGASTRODUODENOSCOPY N/A 09/24/2022   Procedure: ESOPHAGOGASTRODUODENOSCOPY (EGD);  Surgeon: Arta Silence, MD;  Location: [facility];  Service: Gastroenterology;  Laterality: N/A;   HERNIA REPAIR     POLYPECTOMY  09/22/2018   Procedure: POLYPECTOMY;  Surgeon: Arta Silence, MD;  Location: [facility];  Service: Endoscopy;;   SUBMUCOSAL INJECTION  09/22/2018   Procedure: SUBMUCOSAL INJECTION;  Surgeon: Arta Silence, MD;  Location: [facility];  Service: Endoscopy;;  Epinepherine     Her Family History Is Significant For: Family History  Problem Relation Age of Onset   Hypertension Mother    Cancer Father        not sure of what kind   Hypertension Maternal Aunt    Hypertension Paternal Aunt    Hypertension Maternal Grandmother    Sleep apnea Neg Hx     Her Social History Is Significant For: Social History   Socioeconomic History   Marital status: Married    Spouse name: John   Number of children: 1   Years of education: BS   Highest education level: Not on file  Occupational History    Employer: [employer]  Tobacco Use   Smoking status: Never   Smokeless tobacco: Never  Vaping Use   Vaping Use: Never used  Substance and Sexual Activity   Alcohol use: Yes    Comment: occasionally;   Drug use: No   Sexual activity: Yes  Other Topics Concern   Not on file  Social History Narrative   Patient lives at home with family. (husband and son),  Caffeine Use: 2-3 sodas weekly.  Works at [employer].   Social Determinants of Health   Financial Resource Strain: Not on file  Food Insecurity: Not on file  Transportation Needs: Not on file  Physical Activity: Not on file  Stress: Not on file  Social Connections: Not on file    Her Allergies Are:  Allergies  Allergen Reactions   Cortisone Other (See Comments)    Caused pain and lasted 7 months or more--was told it crystalizes in some people and she may be one who is allergic to the medication  DOES NOT EVER WANT THIS MEDICATION AGAIN   Hydrocodone Itching   Lisinopril Other (See Comments)    Made her tired   Oxycodone-Acetaminophen Nausea Only   Pantoprazole Other (See Comments)    Sores on lips   Rofecoxib     REACTION: asthma like symptoms   Tramadol Other (See Comments)  :   Her Current Medications Are:  Outpatient Encounter Medications as of 12/22/2022  Medication Sig   acetaminophen (TYLENOL) 650 MG CR tablet Take 650 mg by mouth every 8 (eight) hours as needed for pain.   albuterol  (PROVENTIL HFA;VENTOLIN HFA) 108 (90 BASE) MCG/ACT inhaler Inhale 2 puffs into the lungs every 6 (six) hours as needed for wheezing or shortness of breath.    albuterol (PROVENTIL) (2.5 MG/3ML) 0.083% nebulizer solution Take 2.5 mg by nebulization every 6 (six) hours as needed for wheezing or shortness of breath.    aspirin EC ([employer] ASPIRIN LOW DOSE) 81 MG tablet TAKE 1 WHOLE TABLET BY MOUTH DAILY   cetirizine (ZYRTEC) 10 MG tablet Take 10 mg by mouth daily as needed for allergies.   diclofenac Sodium (VOLTAREN) 1 % GEL Apply 2 g topically as needed (Pain).   famotidine (PEPCID) 40 MG tablet Take 40 mg  by mouth daily as needed for heartburn or indigestion.   fluocinonide ointment (LIDEX) [id number] % Apply 1 Application topically 2 (two) times daily as needed (Eczema).   guaiFENesin-codeine (ROBITUSSIN AC) 100-10 MG/5ML syrup Take 5 mLs by mouth 3 (three) times daily as needed for cough.   ibuprofen (ADVIL) 800 MG tablet Take 800 mg by mouth daily.   levonorgestrel-ethinyl estradiol (SEASONALE,INTROVALE,JOLESSA) 0.15-0.03 MG tablet Take 1 tablet by mouth daily.   metoprolol succinate (TOPROL-XL) 25 MG 24 hr tablet TAKE 1 TABLET BY MOUTH EVERY DAY IN THE MORNING   oxyCODONE (OXY IR/ROXICODONE) 5 MG immediate release tablet Take 5 mg by mouth daily as needed for moderate pain or severe pain.   Respiratory Therapy Supplies (AIRS DISPOSABLE NEBULIZER) KIT    valsartan-hydrochlorothiazide (DIOVAN-HCT) 160-25 MG per tablet Take 1 tablet by mouth daily.    aspirin EC 81 MG tablet Take 81 mg by mouth daily. Swallow whole.   cholecalciferol (VITAMIN D3) 25 MCG (1000 UNIT) tablet Take 1,000 Units by mouth daily. (Patient not taking: Reported on 12/22/2022)   Multiple Vitamins-Minerals (MULTIVITAMIN WITH MINERALS) tablet Take 1 tablet by mouth daily. 50+ (Patient not taking: Reported on 12/22/2022)   No facility-administered encounter medications on file as of 12/22/2022.  :   Review of Systems:  Out of a complete 14  point review of systems, all are reviewed and negative with the exception of these symptoms as listed below:   Review of Systems  Neurological:        Pt here for sleep consult Pt snores,hypertension ,headaches   Pt denies fatigue,CPAP machine Pt states had sleep study 10+ years ago Pt states she was mild    ESS:6 FSS:17     Objective:  Neurological Exam  Physical Exam Physical Examination:   Vitals:   12/22/22 1516 12/22/22 1524  BP: (!) 149/86 (!) 143/85  Pulse: 72 71    General Examination: The patient is a very pleasant 57 y.o. female in no acute distress. She appears well-developed and well-nourished and well groomed.   HEENT: Normocephalic, atraumatic, pupils are equal, round and reactive to light, extraocular tracking is good without limitation to gaze excursion or nystagmus noted. Hearing is grossly intact. Face is symmetric with normal facial animation. Speech is clear with no dysarthria noted. There is no hypophonia. There is no lip, neck/head, jaw or voice tremor. Neck is supple with full range of passive and active motion. There are no carotid bruits on auscultation. Oropharynx exam reveals: mild mouth dryness, adequate dental hygiene and moderate airway crowding, due to small airway, Mallampati class III, tonsils on the smaller side and uvula on the smaller side, tongue on the wider side.  Neck circumference 15 1/8 inches.  Tongue protrudes centrally and palate elevates symmetrically.  She has a moderate overbite.  Chest: Clear to auscultation without wheezing, rhonchi or crackles noted.  Heart: S1+S2+0, regular and normal without murmurs, rubs or gallops noted.   Abdomen: Soft, non-tender and non-distended.  Extremities: There is 1+ pitting edema in the distal lower extremities bilaterally.   Skin: Warm and dry without trophic changes noted.   Musculoskeletal: exam reveals no obvious joint deformities but discomfort in her lower extremities, she walks with a cane  and reports joint stiffness.   Neurologically:  Mental status: The patient is awake, alert and oriented in all 4 spheres. Her immediate and remote memory, attention, language skills and fund of knowledge are appropriate. There is no evidence of aphasia, agnosia, apraxia or anomia. Speech is  clear with normal prosody and enunciation. Thought process is linear. Mood is normal and affect is normal.  Cranial nerves II - XII are as described above under HEENT exam.  Motor exam: Normal bulk, strength and tone is noted. There is no obvious action or resting tremor.  Fine motor skills and coordination: grossly intact.  Cerebellar testing: No dysmetria or intention tremor. There is no truncal or gait ataxia.  Sensory exam: intact to light touch in the upper and lower extremities.  Gait, station and balance: She stands with difficulty and stands up slowly, she walks with a single-point cane.    Assessment and Plan:  In summary, Alexis Cook is a very pleasant 57 y.o.-year old female with an underlying medical history of migraine headaches, vertigo, tinnitus (seen by my colleague, Dr. Leta Baptist), anxiety, depression, arthritis, asthma, back pain, hypertension, palpitations, paroxysmal A-fib, and severe obesity with a BMI of over 50, who presents for evaluation of her obstructive sleep apnea.  She was previously diagnosed several years ago with what sounds like mild obstructive sleep apnea but has not been on PAP therapy.  Given her weight gain, and her sleep related complaints, including nocturia, small airway entry and recent diagnosis of paroxysmal A-fib, reevaluation is warranted and consideration of treatment should be discussed.   While a laboratory attended sleep study is typically considered "gold standard" for evaluation of sleep disordered breathing, for her convenience and for reevaluation, we will pursue a home sleep test at this point.   I had a long chat with the patient about my findings and  the diagnosis of sleep apnea, particularly OSA, its prognosis and treatment options. We talked about medical/conservative treatments, surgical interventions and non-pharmacological approaches for symptom control. I explained, in particular, the risks and ramifications of untreated moderate to severe OSA, especially with respect to developing cardiovascular disease down the road, including congestive heart failure (CHF), difficult to treat hypertension, cardiac arrhythmias (particularly A-fib), neurovascular complications including TIA, stroke and dementia. Even type 2 diabetes has, in part, been linked to untreated OSA. Symptoms of untreated OSA may include (but may not be limited to) daytime sleepiness, nocturia (i.e. frequent nighttime urination), memory problems, mood irritability and suboptimally controlled or worsening mood disorder such as depression and/or anxiety, lack of energy, lack of motivation, physical discomfort, as well as recurrent headaches, especially morning or nocturnal headaches. We talked about the importance of maintaining a healthy lifestyle and striving for healthy weight. In addition, we talked about the importance of striving for and maintaining good sleep hygiene. I recommended a sleep study at this time. I outlined the differences between a laboratory attended sleep study which is considered more comprehensive and accurate over the option of a home sleep test (HST); the latter may lead to underestimation of sleep disordered breathing in some instances and does not help with diagnosing upper airway resistance syndrome and is not accurate enough to diagnose primary central sleep apnea typically. I outlined possible surgical and non-surgical treatment options of OSA, including the use of a positive airway pressure (PAP) device (i.e. CPAP, AutoPAP/APAP or BiPAP in certain circumstances), a custom-made dental device (aka oral appliance, which would require a referral to a specialist  dentist or orthodontist typically, and is generally speaking not considered for patients with full dentures or edentulous state), upper airway surgical options, such as traditional UPPP (which is not considered a first-line treatment) or the Inspire device (hypoglossal nerve stimulator, which would involve a referral for consultation with an ENT surgeon, after  careful selection, following inclusion criteria - also not first-line treatment). I explained the PAP treatment option to the patient in detail, as this is generally considered first-line treatment.  The patient indicated that she would be willing to try PAP therapy, if the need arises. I explained the importance of being compliant with PAP treatment, not only for insurance purposes but primarily to improve patient's symptoms symptoms, and for the patient's long term health benefit, including to reduce Her cardiovascular risks longer-term.    We will pick up our discussion about the next steps and treatment options after testing.  We will keep her posted as to the test results by phone call and/or MyChart messaging where possible.  We will plan to follow-up in sleep clinic accordingly as well.  I answered all her questions today and the patient was in agreement.   I encouraged her to call with any interim questions, concerns, problems or updates or email [name] through [place].  Generally speaking, sleep test authorizations may take up to 2 weeks, sometimes less, sometimes longer, the patient is encouraged to get in touch with [name] if they do not hear back from the sleep lab staff directly within the next 2 weeks.  Thank you very much for allowing me to participate in the care of this nice patient. If I can be of any further assistance to you please do not hesitate to call me at [phone].  Sincerely,   Star Age, MD, PhD

## 2022-12-22 NOTE — Patient Instructions (Signed)
Thank you for choosing [facility] for your sleep related care! It was nice to meet you today!   Here is what we discussed today:    Based on your symptoms and your exam I believe you are still at risk for obstructive sleep apnea (aka OSA). We should proceed with a sleep study to determine whether you do or do not have OSA and how severe it is. Even, if you have mild OSA, I may want you to consider treatment with CPAP, as treatment of even borderline or mild sleep apnea can result and improvement of symptoms such as sleep disruption, daytime sleepiness, nighttime bathroom breaks, restless leg symptoms, improvement of headache syndromes, even improved mood disorder.   As explained, an attended sleep study (meaning you get to stay overnight in the sleep lab), lets [name] monitor sleep-related behaviors such as sleep talking and leg movements in sleep, in addition to monitoring for sleep apnea.  A home sleep test is a screening tool for sleep apnea diagnosis only, but unfortunately, does not help with any other sleep-related diagnoses.  For your convenience, we will proceed with a home sleep test.  Please remember, the long-term risks and ramifications of untreated moderate to severe obstructive sleep apnea may include (but are not limited to): increased risk for cardiovascular disease, including congestive heart failure, stroke, difficult to control hypertension, treatment resistant obesity, arrhythmias, especially irregular heartbeat commonly known as A. Fib. (atrial fibrillation); even type 2 diabetes has been linked to untreated OSA.   Other correlations that untreated obstructive sleep apnea include macular edema which is swelling of the retina in the eyes, droopy eyelid syndrome, and elevated hemoglobin and hematocrit levels (often referred to as polycythemia).  Sleep apnea can cause disruption of sleep and sleep deprivation in most cases, which, in turn, can cause recurrent headaches,  problems with memory, mood, concentration, focus, and vigilance. Most people with untreated sleep apnea report excessive daytime sleepiness, which can affect their ability to drive. Please do not drive or use heavy equipment or machinery, if you feel sleepy! Patients with sleep apnea can also develop difficulty initiating and maintaining sleep (aka insomnia).   Having sleep apnea may increase your risk for other sleep disorders, including involuntary behaviors sleep such as sleep terrors, sleep talking, sleepwalking.    Having sleep apnea can also increase your risk for restless leg syndrome and leg movements at night.   Please note that untreated obstructive sleep apnea may carry additional perioperative morbidity. Patients with significant obstructive sleep apnea (typically, in the moderate to severe degree) should receive, if possible, perioperative PAP (positive airway pressure) therapy and the surgeons and particularly the anesthesiologists should be informed of the diagnosis and the severity of the sleep disordered breathing.   We will call you or email you through [place] with regards to your test results and plan a follow-up in sleep clinic accordingly. Most likely, you will hear from one of our nurses.   Our sleep lab administrative assistant will call you to schedule your sleep study and give you further instructions, regarding the check in process for the sleep study, arrival time, what to bring, when you can expect to leave after the study, etc., and to answer any other logistical questions you may have. If you don't hear back from her by about 2 weeks from now, please feel free to call her direct line at [phone] or you can call our general clinic number, or email [name] through My Chart.

## 2023-01-07 ENCOUNTER — Ambulatory Visit (INDEPENDENT_AMBULATORY_CARE_PROVIDER_SITE_OTHER): Payer: 59 | Admitting: Neurology

## 2023-01-07 DIAGNOSIS — I480 Paroxysmal atrial fibrillation: Secondary | ICD-10-CM | POA: Diagnosis not present

## 2023-01-07 DIAGNOSIS — Z8669 Personal history of other diseases of the nervous system and sense organs: Secondary | ICD-10-CM | POA: Diagnosis not present

## 2023-01-07 DIAGNOSIS — G4733 Obstructive sleep apnea (adult) (pediatric): Secondary | ICD-10-CM

## 2023-01-07 DIAGNOSIS — R635 Abnormal weight gain: Secondary | ICD-10-CM

## 2023-01-07 DIAGNOSIS — R351 Nocturia: Secondary | ICD-10-CM

## 2023-01-14 NOTE — Procedures (Signed)
[facility]  HOME SLEEP TEST (Watch PAT) REPORT  STUDY DATE: 01/11/2023  DOB: 1966/01/14  MRN: [id number]  ORDERING CLINICIAN: Star Age, MD, PhD   REFERRING CLINICIAN: Maryruth Bun, NP  CLINICAL INFORMATION/HISTORY: 57 year old female with an underlying medical history of migraine headaches, vertigo, tinnitus (seen by my colleague, Dr. Leta Baptist), anxiety, depression, arthritis, asthma, back pain, hypertension, palpitations, paroxysmal A-fib, and severe obesity with a BMI of over 56, who was previously diagnosed with obstructive sleep apnea but is currently not on PAP therapy.   Epworth sleepiness score: 6/24.  BMI: 52.3 kg/m  FINDINGS:   Sleep Summary:   Total Recording Time (hours, min): 7 hours, 55 min  Total Sleep Time (hours, min):  5 hours, 6 min  Percent REM (%):    12.6%   Respiratory Indices:   Calculated pAHI (per hour):  17.9/hour         REM pAHI:    34.4/hour       NREM pAHI: 15.5/hour  Central pAHI: 1.4/hour  Oxygen Saturation Statistics:    Oxygen Saturation (%) Mean: 95%   Minimum oxygen saturation (%):                 70%   O2 Saturation Range (%): 70 - 98%    O2 Saturation (minutes) <=88%: 0.2 min  Pulse Rate Statistics:   Pulse Mean (bpm):    71/min    Pulse Range (54 - 109/min)   IMPRESSION: OSA (obstructive sleep apnea)   RECOMMENDATION:  This home sleep test demonstrates moderate obstructive sleep apnea with a total AHI of 17.9/hour and O2 nadir of 70%.  Variable snoring was detected, ranging from mild to louder, at times intermittent. Treatment with a positive airway pressure (PAP) device is recommended. The patient will be advised to proceed with an autoPAP titration/trial at home for now. A full night titration study may be considered to optimize treatment settings, monitor proper oxygen saturations and aid with improvement of tolerance and adherence, if needed down the road. Alternative treatment  options may include a dental device through dentistry or orthodontics in selected patients or Inspire (hypoglossal nerve stimulator) in carefully selected patients (meeting inclusion criteria).  Concomitant weight loss is recommended (where clinically appropriate). Please note that untreated obstructive sleep apnea may carry additional perioperative morbidity. Patients with significant obstructive sleep apnea should receive perioperative PAP therapy and the surgeons and particularly the anesthesiologist should be informed of the diagnosis and the severity of the sleep disordered breathing. The patient should be cautioned not to drive, work at heights, or operate dangerous or heavy equipment when tired or sleepy. Review and reiteration of good sleep hygiene measures should be pursued with any patient. Other causes of the patient's symptoms, including circadian rhythm disturbances, an underlying mood disorder, medication effect and/or an underlying medical problem cannot be ruled out based on this test. Clinical correlation is recommended.  The patient and her referring provider will be notified of the test results. The patient will be seen in follow up in sleep clinic at [facility].  I certify that I have reviewed the raw data recording prior to the issuance of this report in accordance with the standards of the American Academy of Sleep Medicine (AASM).    INTERPRETING PHYSICIAN:   Star Age, MD, PhD Medical Director, [facility]) [place], ABPN (Neurology and Sleep)   [facility] [address] [place], [place] [address] [phone]

## 2023-01-14 NOTE — Addendum Note (Signed)
Addended by: Star Age on: 01/14/2023 05:11 PM   Modules accepted: Orders

## 2023-01-15 ENCOUNTER — Telehealth: Payer: Self-pay | Admitting: *Deleted

## 2023-01-15 NOTE — Telephone Encounter (Signed)
Spoke with the patient and discussed her sleep study results which show obstructive sleep apnea in the moderate range.  I advised her that Dr. Rexene Alberts recommends treatment in the form of AutoPap.  Patient is amenable to proceeding.  We discussed the difference between CPAP and AutoPap.  Patient is okay with a [place] location for her DME company.  Will refer to [place].  Patient will watch for a call within approximately 3 business days.  She scheduled an initial follow-up appointment on April 14 2023 at 7:45 AM arrival 15 to 30 minutes early.  Patient aware of insurance compliance requirements which includes using the machine for at least 4 hours at night and also being seen in our office between 30 and 90 days after set up.  Her questions were answered.  She was very Patent attorney.  Referral faxed to [place]. Received a receipt of confirmation. Report sent to [facility] attn: brittany stephenson np.

## 2023-01-15 NOTE — Telephone Encounter (Signed)
-----   Message from Star Age, MD sent at 01/14/2023  5:11 PM EDT ----- Patient referred by Maryruth Bun, NP, seen by me on 12/22/2022, patient had a HST on 01/11/2023.    Please call and notify the patient that the recent home sleep test showed obstructive sleep apnea in the moderate range. I recommend treatment in the form of autoPAP, which means, that we don't have to bring her in for a sleep study with CPAP, but will let her start using a so called autoPAP machine at home, which is a CPAP-like machine with self-adjusting pressures. We will send the order to a local DME company (of her choice, or as per insurance requirement). The DME representative will fit her with a mask, educate her on how to use the machine, how to put the mask on, etc. I have placed an order in the chart. Please send the order, talk to patient, send report to referring MD. We will need a FU in sleep clinic for 10 weeks post-PAP set up, please arrange that with me or one of our NPs. Also reinforce the need for compliance with treatment. Thanks,   Star Age, MD, PhD [facility])

## 2023-01-27 ENCOUNTER — Telehealth: Payer: Self-pay

## 2023-01-27 NOTE — Telephone Encounter (Signed)
Patient called and asked about taking 2 new meds buprenorphine patch and Cymbalta 30mg  Wants to know if these are ok. Also she thinks you told her not to take tylenol but to take Advil motrin ibuprofen instead

## 2023-01-30 ENCOUNTER — Telehealth: Payer: Self-pay

## 2023-01-30 NOTE — Telephone Encounter (Signed)
See the other message.   Dorien Mayotte Frederick, DO, [facility]

## 2023-01-30 NOTE — Telephone Encounter (Signed)
Buprenorphine patch and Cymbalta are okay if indicated by her other provider.  Avoid excessive use of either Advil or motrin - she can reach out to her PCP.   Jahmya Onofrio [place], DO, [facility]

## 2023-01-30 NOTE — Telephone Encounter (Signed)
Patient called and asked about taking 2 new meds buprenorphine patch and Cymbalta 30mg Wants to know if these are ok. Also she thinks you told her not to take tylenol but to take Advil motrin ibuprofen instead  

## 2023-03-02 ENCOUNTER — Telehealth: Payer: Self-pay | Admitting: Neurology

## 2023-03-02 DIAGNOSIS — G4733 Obstructive sleep apnea (adult) (pediatric): Secondary | ICD-10-CM

## 2023-03-02 NOTE — Telephone Encounter (Signed)
Spoke with the patient and discussed the order Dr Frances Furbish placed to increase minimum pressure to 7 cm. Patient verbalized understanding. Her questions were answered. Patient will be seeing [facility] again on Friday so some of her questions regarding the resmed app, etc will be directed to them at that time. Patient states headgear-wise, she tolerated the FFM better than nasal interface. When she was refit with a FFM the staff threw away the nasal interface. She does state she can tell when the machine is on vs when it is off but the pressure has not felt high enough and she does have anxiety. She will try the increased minimum pressure for the next 1-2 nights and then let us know how she is feeling. We discussed compliance and patient is aware to use the machine for at least 4 hours at night. Patient was very appreciative for the call.   In resmed portal I increased the minimum pressure to 7.

## 2023-03-02 NOTE — Telephone Encounter (Signed)
Pt stated she needs a pressure increase. Stated it doesn't feel like anything is happening.

## 2023-03-02 NOTE — Addendum Note (Signed)
Addended by: Huston Foley on: 03/02/2023 04:49 PM   Modules accepted: Orders

## 2023-03-02 NOTE — Telephone Encounter (Signed)
Order faxed to [facility]. Received a receipt of confirmation.  

## 2023-03-02 NOTE — Telephone Encounter (Signed)
We can increase her min pressure to 7 cm for better tolerance. Her average pressure is 9 cm, so increasing max pressure will unlikely make a difference. Please let pt know. Also, apnea control is very good and leak from the mask is acceptable. Please encourage ongoing full compliance.

## 2023-03-04 ENCOUNTER — Telehealth: Payer: Self-pay | Admitting: Neurology

## 2023-03-04 NOTE — Telephone Encounter (Signed)
Pt asking if nurse can call her insurance and ask for reduction in hours for per hours and day on her CPAP. Stated she has anxiety and can't sleep on it for 4 hours a day.

## 2023-03-05 NOTE — Telephone Encounter (Signed)
Spoke to patient having issues wearing  mask at night . Pt states lots of anxiety and hard to wear mask 4 hours nightly Pt states spoke to her insurance company  Pt states per insurance([employer])   states there is a form online that can except her from wearing CPAP 4 + hours nightly. Informed patient never heard of this form before . Gave Patient fax# for insurance company to fax  [name] that particular form/ Pt states ok Will send Dr Frances Furbish a message to make her aware of the patient having issues  and see if there are other treatment options and the form we discussed.  Pt expressed understanding and thanked me for calling

## 2023-03-05 NOTE — Telephone Encounter (Signed)
See phone note

## 2023-03-09 NOTE — Telephone Encounter (Signed)
Referral to dentistry signed, thanks.

## 2023-03-09 NOTE — Addendum Note (Signed)
Addended by: Huston Foley on: 03/09/2023 04:57 PM   Modules accepted: Orders

## 2023-03-09 NOTE — Telephone Encounter (Signed)
I recommend that she start using her CPAP before she falls asleep, to slowly get used to the mask and pressure, she can also make an appointment with her DME provider to discuss mask tolerance issues and something she may be able to use more consistently.  Unfortunately, she has significant obstructive sleep apnea with significant oxygen drops, we can certainly consider an oral appliance for her and make a referral to dentistry if she would like to consider this.  If she can bring as or mail as a form from her insurance that she has mentioned regarding the usage minimum requirement through insurance, I would be happy to look at the form.

## 2023-03-09 NOTE — Addendum Note (Signed)
Addended by: Raynald Kemp A on: 03/09/2023 04:53 PM   Modules accepted: Orders

## 2023-03-09 NOTE — Telephone Encounter (Signed)
Spoke to patient gave Dr Johny Sax recommendation. Pt states will call DME company today or tomorrow to set appointment for mask refitting. Pt agreeable to dental referral  Pt  didn't mention a form from [facility]  Pt expressed understanding and thanked me for calling [facility]  order this evening

## 2023-03-10 ENCOUNTER — Telehealth: Payer: Self-pay | Admitting: Neurology

## 2023-03-10 NOTE — Telephone Encounter (Signed)
Referral sent to Dr. Katz [phone] 

## 2023-04-06 ENCOUNTER — Telehealth: Payer: Self-pay | Admitting: Neurology

## 2023-04-06 NOTE — Telephone Encounter (Signed)
Spoke to patient . Pt states had cold for two weeks Pt states better now Pt states when she has a cold or congestion is she suppose to stop using pap machine until she feels better .Informed pt that if she can tolerate pap machine still use it nightly Pt states she did wear but it seemed that cold last longer because she was on pap  machine Pt wants Dr Frances Furbish to give give suggestions about pap usage when you have a cold and congestion Pt thanked me for calling  Will forward question to Dr. Frances Furbish

## 2023-04-06 NOTE — Telephone Encounter (Signed)
Pt is asking if she is supposed to use her CPAP with a cold and congestion, please call.

## 2023-04-07 NOTE — Telephone Encounter (Signed)
For cold and congestion, we usually recommend skipping the CPAP a night or 2 or as long as needed so nasal discharge and congestion does not get pushed up into the nasal sinuses.  I recommend using a nasal rinse system such as Neti pot to help with nasal congestion.

## 2023-04-14 ENCOUNTER — Ambulatory Visit: Payer: 59 | Admitting: Neurology

## 2023-04-14 ENCOUNTER — Encounter: Payer: Self-pay | Admitting: Neurology

## 2023-04-14 VITALS — BP 142/87 | HR 67 | Ht 63.0 in | Wt 296.0 lb

## 2023-04-14 DIAGNOSIS — R051 Acute cough: Secondary | ICD-10-CM | POA: Diagnosis not present

## 2023-04-14 DIAGNOSIS — R0981 Nasal congestion: Secondary | ICD-10-CM

## 2023-04-14 DIAGNOSIS — Z789 Other specified health status: Secondary | ICD-10-CM

## 2023-04-14 DIAGNOSIS — G4733 Obstructive sleep apnea (adult) (pediatric): Secondary | ICD-10-CM

## 2023-04-14 NOTE — Progress Notes (Signed)
Subjective:    Patient ID: Alexis Cook is a 57 y.o. female.  HPI    Interim history:   Alexis Cook is a 57 year old female with an underlying medical history of migraine headaches, vertigo, tinnitus (seen by my colleague, Dr. Marjory Lies), anxiety, depression, arthritis, asthma, back pain, hypertension, palpitations, paroxysmal A-fib, and severe obesity with a BMI of over 50, who presents for follow-up consultation of her obstructive sleep apnea after interim testing and starting home AutoPap therapy.  The patient is unaccompanied today.  I first met her at the request of her primary care nurse practitioner on 12/22/2022, at which time she reported a prior diagnosis of sleep apnea.  She was not on PAP therapy at the time.  She had interim weight gain.  She reported restless sleep.  She was advised to proceed with a sleep study.  She had a home sleep test on 01/11/2023 which showed moderate obstructive sleep apnea with an AHI of 17.9/h, O2 nadir 70% with variable snoring detected, ranging from mild to louder.  She was advised to proceed with home AutoPap therapy.  Her set up date was 02/18/2023.  She has a ResMed air sense 10 AutoSet machine.  Her DME is [facility].  She called in the interim reporting that she needed a higher pressure.  I changed her minimum pressure from 5 cm to 7 cm at the time.  She was encouraged to make an appointment with her DME provider for mask refit.  She also reported anxiety regarding her CPAP usage and was agreeable to getting a referral to dentistry as well, to consider an oral appliance.    Today, 04/14/2023: I reviewed her AutoPap compliance data from 02/18/2023 through 03/19/2023, which is a total of 30 days, during which time she used her machine every night with percent use days greater than 4 hours at 100%, indicating superb compliance with an average usage of 4 hours and 20 minutes, residual AHI at goal at 0.7/h, average pressure for the 95th percentile at 8.9 cm, range  of 7 to 12 cm with EPR of 2.  Leak acceptable with the 95th percentile at 11.2 L/min.  She reports still not feeling fully adjusted to treatment but she does like her AutoPap a little better now.  She developed a cold in the recent week and has a lingering cough and congestion especially when she tries to lie down.  She feels like the cough is better when she is upright.  She has not been able to see her PCP yet as they have no appointments available, she may go to urgent care today.  She has had some productive cough and discolored sputum.  She has skipped the last few nights.  She has tried several different interfaces since starting her AutoPap, started off with a nasal cushion interface and then nasal pillows but currently is using a nasal mask which is generally fine but she does have soreness around the nostrils.  She has put Neosporin on it.  She is motivated to continue with treatment but would like a referral to dentistry as discussed, so she can use an oral appliance as a backup or concomitantly with her AutoPap.  She still has some anxiety around it.  The patient's allergies, current medications, family history, past medical history, past social history, past surgical history and problem list were reviewed and updated as appropriate.   Previously:   12/22/22: (She) was previously diagnosed with obstructive sleep apnea but is currently not on PAP therapy.  Her Epworth sleepiness score is 6 out of 24, fatigue severity score is 17 out of 63. I reviewed your office note from 08/27/2022.  Prior sleep study results are not available for my review today.  As per her recollection, sleep testing was over 10 years ago and she had mild sleep apnea at the time.  She had testing at the [facility] sleep lab.  She does report weight gain over time.  She has arthritis and has restless sleep, does not typically sleep in the same bedroom with her husband as he is also a restless sleeper.  Her bedtime is generally around 930  or 10 PM but she does watch something on her phone.  She may be asleep somewhere around 11 PM on most nights.  Rise time is 4:45 AM.  She works in the Location manager at [employer].  She lives with her husband, she has a grown son age 48.  She currently does not have any pets in her household, her cat is at her son's place.  She drinks caffeine in limitation, typically 2 cups of coffee in the morning, occasional soda, very occasional tea.  She denies recurrent nocturnal or morning headaches.  She does have nasal congestion and postnasal drip at times.  She has nocturia about once or twice per average night.  She has no first-degree relative with sleep apnea but a cousin on mom's side with sleep apnea.     Her Past Medical History Is Significant For: Past Medical History:  Diagnosis Date   Anxiety and depression    Arthritis    knee pain-uses crutches at times in house   Asthma    as child and occ   Back pain    Dysrhythmia    hx of Afib   GERD (gastroesophageal reflux disease)    Hx of migraines    Hypertension    Obesity    PONV (postoperative nausea and vomiting)    Sleep apnea    mild-no cpap needed    Her Past Surgical History Is Significant For: Past Surgical History:  Procedure Laterality Date   BIOPSY  09/24/2022   Procedure: BIOPSY;  Surgeon: Willis Modena, MD;  Location: [facility];  Service: Gastroenterology;;   Fidela Salisbury RELEASE  03/09/2012   Procedure: CARPAL TUNNEL RELEASE;  Surgeon: Nicki Reaper, MD;  Location: [facility];  Service: Orthopedics;  Laterality: Right;   CESAREAN SECTION     CHOLECYSTECTOMY     COLONOSCOPY WITH PROPOFOL N/A 09/22/2018   Procedure: COLONOSCOPY WITH PROPOFOL;  Surgeon: Willis Modena, MD;  Location: [facility];  Service: Endoscopy;  Laterality: N/A;   COLONOSCOPY WITH PROPOFOL N/A 09/24/2022   Procedure: COLONOSCOPY WITH PROPOFOL;  Surgeon: Willis Modena, MD;  Location: [facility];  Service:  Gastroenterology;  Laterality: N/A;   ESOPHAGOGASTRODUODENOSCOPY N/A 09/24/2022   Procedure: ESOPHAGOGASTRODUODENOSCOPY (EGD);  Surgeon: Willis Modena, MD;  Location: [facility];  Service: Gastroenterology;  Laterality: N/A;   HERNIA REPAIR     POLYPECTOMY  09/22/2018   Procedure: POLYPECTOMY;  Surgeon: Willis Modena, MD;  Location: [facility];  Service: Endoscopy;;   SUBMUCOSAL INJECTION  09/22/2018   Procedure: SUBMUCOSAL INJECTION;  Surgeon: Willis Modena, MD;  Location: [facility];  Service: Endoscopy;;  Epinepherine    Her Family History Is Significant For: Family History  Problem Relation Age of Onset   Hypertension Mother    Cancer Father        not sure of what kind  Hypertension Maternal Aunt    Hypertension Paternal Aunt    Hypertension Maternal Grandmother    Sleep apnea Neg Hx     Her Social History Is Significant For: Social History   Socioeconomic History   Marital status: Married    Spouse name: John   Number of children: 1   Years of education: BS   Highest education level: Not on file  Occupational History    Employer: [employer]  Tobacco Use   Smoking status: Never   Smokeless tobacco: Never  Vaping Use   Vaping Use: Never used  Substance and Sexual Activity   Alcohol use: Yes    Comment: occasionally;   Drug use: No   Sexual activity: Yes  Other Topics Concern   Not on file  Social History Narrative   Patient lives at home with family. (husband and son),  Caffeine Use: 2-3 sodas weekly.  Works at [employer].   Social Determinants of Health   Financial Resource Strain: Not on file  Food Insecurity: Not on file  Transportation Needs: Not on file  Physical Activity: Not on file  Stress: Not on file  Social Connections: Not on file    Her Allergies Are:  Allergies  Allergen Reactions   Cortisone Other (See Comments)    Caused pain and lasted 7 months or more--was told it crystalizes in some people and she may be one who is  allergic to the medication  DOES NOT EVER WANT THIS MEDICATION AGAIN   Hydrocodone Itching   Lisinopril Other (See Comments)    Made her tired   Oxycodone-Acetaminophen Nausea Only   Pantoprazole Other (See Comments)    Sores on lips   Rofecoxib     REACTION: asthma like symptoms   Tramadol Other (See Comments)  :   Her Current Medications Are:  Outpatient Encounter Medications as of 04/14/2023  Medication Sig   acetaminophen (TYLENOL) 650 MG CR tablet Take 650 mg by mouth every 8 (eight) hours as needed for pain.   albuterol (PROVENTIL HFA;VENTOLIN HFA) 108 (90 BASE) MCG/ACT inhaler Inhale 2 puffs into the lungs every 6 (six) hours as needed for wheezing or shortness of breath.    albuterol (PROVENTIL) (2.5 MG/3ML) 0.083% nebulizer solution Take 2.5 mg by nebulization every 6 (six) hours as needed for wheezing or shortness of breath.    aspirin EC ([employer] ASPIRIN LOW DOSE) 81 MG tablet TAKE 1 WHOLE TABLET BY MOUTH DAILY   aspirin EC 81 MG tablet Take 81 mg by mouth daily. Swallow whole.   buprenorphine (BUTRANS) 5 MCG/HR PTWK 1 patch once a week.   cetirizine (ZYRTEC) 10 MG tablet Take 10 mg by mouth daily as needed for allergies.   cholecalciferol (VITAMIN D3) 25 MCG (1000 UNIT) tablet Take 1,000 Units by mouth daily.   diclofenac Sodium (VOLTAREN) 1 % GEL Apply 2 g topically as needed (Pain).   DULoxetine (CYMBALTA) 30 MG capsule Take 30 mg by mouth at bedtime.   famotidine (PEPCID) 40 MG tablet Take 40 mg by mouth daily as needed for heartburn or indigestion.   fluocinonide ointment (LIDEX) 0.05 % Apply 1 Application topically 2 (two) times daily as needed (Eczema).   guaiFENesin-codeine (ROBITUSSIN AC) 100-10 MG/5ML syrup Take 5 mLs by mouth 3 (three) times daily as needed for cough.   ibuprofen (ADVIL) 800 MG tablet Take 800 mg by mouth daily.   levonorgestrel-ethinyl estradiol (SEASONALE,INTROVALE,JOLESSA) 0.15-0.03 MG tablet Take 1 tablet by mouth daily.   metoprolol  succinate  (TOPROL-XL) 25 MG 24 hr tablet TAKE 1 TABLET BY MOUTH EVERY DAY IN THE MORNING   Multiple Vitamins-Minerals (MULTIVITAMIN WITH MINERALS) tablet Take 1 tablet by mouth daily. 50+   oxyCODONE (OXY IR/ROXICODONE) 5 MG immediate release tablet Take 5 mg by mouth daily as needed for moderate pain or severe pain.   Respiratory Therapy Supplies (AIRS DISPOSABLE NEBULIZER) KIT    valsartan-hydrochlorothiazide (DIOVAN-HCT) 160-25 MG per tablet Take 1 tablet by mouth daily.    No facility-administered encounter medications on file as of 04/14/2023.  :  Review of Systems:  Out of a complete 14 point review of systems, all are reviewed and negative with the exception of these symptoms as listed below:   Review of Systems  Neurological:        Pt here for CPAP f/u Pt states have been sick . Pt states not using CPAP a lot due to chest  congestion  Pt states hard to adjust to mask    ESS:5     Objective:  Neurological Exam  Physical Exam Physical Examination:   Vitals:   04/14/23 0737  BP: (!) 142/87  Pulse: 67    General Examination: The patient is a very pleasant 57 y.o. female in no acute distress. She appears well-developed and well-nourished and well groomed.   HEENT: Normocephalic, atraumatic, pupils are equal, round and reactive to light, extraocular tracking is well-preserved, speech is somewhat nasal sounding, no dysarthria.  Face is symmetric with normal facial animation, airway examination reveals no significant pharyngeal erythema or postnasal drainage.  She does not have any open sores around the nose area.  Tongue protrudes centrally and palate elevates symmetrically.    Chest: Clear to auscultation without wheezing, rhonchi or crackles noted.   Heart: S1+S2+0, regular and normal without murmurs, rubs or gallops noted.    Abdomen: Soft, non-tender and non-distended.   Extremities: There is some puffiness noted around the ankles.     Skin: Warm and dry without trophic changes  noted.    Musculoskeletal: exam reveals no obvious joint deformities.  She has a cane.    Neurologically:  Mental status: The patient is awake, alert and oriented in all 4 spheres. Her immediate and remote memory, attention, language skills and fund of knowledge are appropriate. There is no evidence of aphasia, agnosia, apraxia or anomia. Speech is clear with normal prosody and enunciation. Thought process is linear. Mood is normal and affect is normal.  Cranial nerves II - XII are as described above under HEENT exam.  Motor exam: Normal bulk, strength and tone is noted. There is no obvious action or resting tremor.  Fine motor skills and coordination: grossly intact.  Cerebellar testing: No dysmetria or intention tremor. There is no truncal or gait ataxia.  Sensory exam: intact to light touch in the upper and lower extremities.  Gait, station and balance: She stands with difficulty and stands up slowly, she walks with a single-point cane.     Assessment and Plan:  In summary, Alexis Cook is a very pleasant 57 year old female with an underlying medical history of migraine headaches, vertigo, tinnitus (seen by my colleague, Dr. Marjory Lies), anxiety, depression, arthritis, asthma, back pain, hypertension, palpitations, paroxysmal A-fib, and severe obesity with a BMI of over 50, who presents for follow-up consultation of her obstructive sleep apnea after interim testing and starting home AutoPap therapy. She had a home sleep test on 01/11/2023 which showed moderate obstructive sleep apnea with an AHI of 17.9/h, O2 nadir  70% with variable snoring detected, ranging from mild to louder.  She has been on home AutoPap therapy since 02/18/2023.  She has a ResMed air sense 10 AutoSet machine.  Her DME is [facility].  She is generally compliant with treatment but is still adjusting to it.  She has tried different interfaces and is currently using a nasal mask with reasonably good tolerance.  She is commended for  treatment adherence, apnea scores look good, leak is acceptable from the mask, she is advised to try to be consistent with the hours of usage.  She is currently battling a cold and congestion, she is encouraged to use a nasal saline rinse and make an appointment with primary care or go to urgent care if she still has a lingering cough, lung auscultation was clear however.  She is encouraged to use Aquaphor as a healing ointment around the sore areas of her nostrils only during the day when she is not using the mask.  She is advised that should she have a cold and nasal drainage and congestion, she should skip for a few nights as it is not helpful to use CPAP during the acute phase of a cold.   I had placed an order to dentistry in May 2024.  We will re-fax to Dr. Althea Grimmer. She is advised to follow-up routinely in this clinic to see one of our nurse practitioners in about 6 months, and if she is doing well at that time, we can see her yearly thereafter.  I answered all her questions today and she was in agreement with our plan. I spent 40 minutes in total face-to-face time and in reviewing records during pre-charting, more than 50% of which was spent in counseling and coordination of care, reviewing test results, reviewing medications and treatment regimen and/or in discussing or reviewing the diagnosis of OSA, the prognosis and treatment options. Pertinent laboratory and imaging test results that were available during this visit with the patient were reviewed by me and considered in my medical decision making (see chart for details).

## 2023-04-14 NOTE — Patient Instructions (Addendum)
It was nice to see you again today. I am glad to hear, things are going somewhat better.  You are still adjusting to treatment.   I have placed a referral to dentistry. We will have it refaxed to Dr. Althea Grimmer.   Please continue using your autoPAP regularly. While your insurance requires that you use PAP at least 4 hours each night on 70% of the nights, I recommend, that you not skip any nights and use it throughout the night if you can. Getting used to PAP and staying with the treatment long term does take time and patience and discipline. Untreated obstructive sleep apnea when it is moderate to severe can have an adverse impact on cardiovascular health and raise her risk for heart disease, arrhythmias, hypertension, congestive heart failure, stroke and diabetes. Untreated obstructive sleep apnea causes sleep disruption, nonrestorative sleep, and sleep deprivation. This can have an impact on your day to day functioning and cause daytime sleepiness and impairment of cognitive function, memory loss, mood disturbance, and problems focussing. Using PAP regularly can improve these symptoms.  We can see you in 6 months, please make an appointment with one of our nurse practitioners.  You can use aquaphor on the sore spots by your nose.   You should see your PCP or go to urgent care with your ongoing congestion and lingering cough.

## 2023-05-01 ENCOUNTER — Encounter: Payer: Self-pay | Admitting: Neurology

## 2023-05-04 NOTE — Telephone Encounter (Signed)
Recommend follow-up with PCP to discuss referral to ENT or Pulmonology.

## 2023-07-06 ENCOUNTER — Other Ambulatory Visit: Payer: Self-pay | Admitting: Family Medicine

## 2023-07-06 DIAGNOSIS — Z1231 Encounter for screening mammogram for malignant neoplasm of breast: Secondary | ICD-10-CM

## 2023-07-15 ENCOUNTER — Ambulatory Visit
Admission: RE | Admit: 2023-07-15 | Discharge: 2023-07-15 | Disposition: A | Discharge status: Home / Self Care | Payer: 59 | Source: Ambulatory Visit | Attending: Family Medicine | Admitting: Family Medicine

## 2023-07-15 DIAGNOSIS — Z1231 Encounter for screening mammogram for malignant neoplasm of breast: Secondary | ICD-10-CM

## 2023-08-19 ENCOUNTER — Encounter: Payer: Self-pay | Admitting: Pulmonary Disease

## 2023-08-19 ENCOUNTER — Ambulatory Visit: Payer: 59 | Admitting: Pulmonary Disease

## 2023-08-19 VITALS — BP 132/86 | HR 77 | Ht 63.0 in | Wt 298.2 lb

## 2023-08-19 DIAGNOSIS — G4733 Obstructive sleep apnea (adult) (pediatric): Secondary | ICD-10-CM | POA: Diagnosis not present

## 2023-08-19 DIAGNOSIS — R0602 Shortness of breath: Secondary | ICD-10-CM

## 2023-08-19 MED ORDER — ALBUTEROL SULFATE HFA 108 (90 BASE) MCG/ACT IN AERS: 2.0000 | INHALATION_SPRAY | Freq: Four times a day (QID) | RESPIRATORY_TRACT | 6 refills | Status: AC | PRN

## 2023-08-19 NOTE — Patient Instructions (Signed)
Will send in a prescription for albuterol  Try and get back to use your CPAP on a nightly basis  I will see you back in about 3 months  Continue your exercise program  Call us with significant concerns

## 2023-08-19 NOTE — Progress Notes (Signed)
Alexis Cook    [id number]    12-22-1965  Primary Care Physician:Wolters, Jasmine December, MD  Referring Physician: Mila Palmer, MD [address] [place],  [place] [address]  Chief complaint:   Patient being seen for repeated lung infections  HPI:  Recurrent lung infections following initiating CPAP therapy She felt she felt congested following initiating CPAP  She stopped using it for a while and then when she tried to use it again, developed similar symptoms  Has used courses of treatment to treat the congestion recently multiple occasions  Did have a history of asthma growing up  She uses albuterol and nebulization treatments Was using more nebulization treatments recently when she was having the congestion  She has a history of hypertension, history of asthma Allergy and sinus problems  Diagnosed with obstructive sleep apnea in March-moderate obstructive sleep apnea She used CPAP for about a month and then got off using it because she was having recurrent congestion  Never smoker-social smoking when she was much younger No significant secondhand smoke exposure  No pertinent occupational history   Outpatient Encounter Medications as of 08/19/2023  Medication Sig   acetaminophen (TYLENOL) 650 MG CR tablet Take 650 mg by mouth every 8 (eight) hours as needed for pain.   albuterol (PROVENTIL HFA;VENTOLIN HFA) 108 (90 BASE) MCG/ACT inhaler Inhale 2 puffs into the lungs every 6 (six) hours as needed for wheezing or shortness of breath.    albuterol (PROVENTIL) (2.5 MG/3ML) 0.083% nebulizer solution Take 2.5 mg by nebulization every 6 (six) hours as needed for wheezing or shortness of breath.    aspirin EC ([employer] ASPIRIN LOW DOSE) 81 MG tablet TAKE 1 WHOLE TABLET BY MOUTH DAILY   aspirin EC 81 MG tablet Take 81 mg by mouth daily. Swallow whole.   buprenorphine (BUTRANS) 5 MCG/HR PTWK 1 patch once a week.   cetirizine (ZYRTEC) 10 MG tablet Take 10 mg by  mouth daily as needed for allergies.   cholecalciferol (VITAMIN D3) 25 MCG (1000 UNIT) tablet Take 1,000 Units by mouth daily.   diclofenac Sodium (VOLTAREN) 1 % GEL Apply 2 g topically as needed (Pain).   DULoxetine (CYMBALTA) 30 MG capsule Take 30 mg by mouth at bedtime.   famotidine (PEPCID) 40 MG tablet Take 40 mg by mouth daily as needed for heartburn or indigestion.   fluocinonide ointment (LIDEX) 0.05 % Apply 1 Application topically 2 (two) times daily as needed (Eczema).   guaiFENesin-codeine (ROBITUSSIN AC) 100-10 MG/5ML syrup Take 5 mLs by mouth 3 (three) times daily as needed for cough.   ibuprofen (ADVIL) 800 MG tablet Take 800 mg by mouth daily.   levonorgestrel-ethinyl estradiol (SEASONALE,INTROVALE,JOLESSA) 0.15-0.03 MG tablet Take 1 tablet by mouth daily.   metoprolol succinate (TOPROL-XL) 25 MG 24 hr tablet TAKE 1 TABLET BY MOUTH EVERY DAY IN THE MORNING   Multiple Vitamins-Minerals (MULTIVITAMIN WITH MINERALS) tablet Take 1 tablet by mouth daily. 50+   oxyCODONE (OXY IR/ROXICODONE) 5 MG immediate release tablet Take 5 mg by mouth daily as needed for moderate pain or severe pain.   Respiratory Therapy Supplies (AIRS DISPOSABLE NEBULIZER) KIT    valsartan-hydrochlorothiazide (DIOVAN-HCT) 160-25 MG per tablet Take 1 tablet by mouth daily.    No facility-administered encounter medications on file as of 08/19/2023.    Allergies as of 08/19/2023 - Review Complete 08/19/2023  Allergen Reaction Noted   Cortisone Other (See Comments) 11/24/2014   Hydrocodone Itching 09/22/2018   Lisinopril  Other (See Comments) 08/27/2022   Oxycodone-acetaminophen Nausea Only 08/27/2022   Pantoprazole Other (See Comments) 08/27/2022   Rofecoxib  01/09/2010   Tramadol Other (See Comments) 08/27/2022    Past Medical History:  Diagnosis Date   Anxiety and depression    Arthritis    knee pain-uses crutches at times in house   Asthma    as child and occ   Back pain    Dysrhythmia    hx of  Afib   GERD (gastroesophageal reflux disease)    Hx of migraines    Hypertension    Obesity    PONV (postoperative nausea and vomiting)    Sleep apnea    mild-no cpap needed    Past Surgical History:  Procedure Laterality Date   BIOPSY  09/24/2022   Procedure: BIOPSY;  Surgeon: Willis Modena, MD;  Location: [facility];  Service: Gastroenterology;;   Fidela Salisbury RELEASE  03/09/2012   Procedure: CARPAL TUNNEL RELEASE;  Surgeon: Nicki Reaper, MD;  Location: [facility];  Service: Orthopedics;  Laterality: Right;   CESAREAN SECTION     CHOLECYSTECTOMY     COLONOSCOPY WITH PROPOFOL N/A 09/22/2018   Procedure: COLONOSCOPY WITH PROPOFOL;  Surgeon: Willis Modena, MD;  Location: [facility];  Service: Endoscopy;  Laterality: N/A;   COLONOSCOPY WITH PROPOFOL N/A 09/24/2022   Procedure: COLONOSCOPY WITH PROPOFOL;  Surgeon: Willis Modena, MD;  Location: [facility];  Service: Gastroenterology;  Laterality: N/A;   ESOPHAGOGASTRODUODENOSCOPY N/A 09/24/2022   Procedure: ESOPHAGOGASTRODUODENOSCOPY (EGD);  Surgeon: Willis Modena, MD;  Location: [facility];  Service: Gastroenterology;  Laterality: N/A;   HERNIA REPAIR     POLYPECTOMY  09/22/2018   Procedure: POLYPECTOMY;  Surgeon: Willis Modena, MD;  Location: [facility];  Service: Endoscopy;;   SUBMUCOSAL INJECTION  09/22/2018   Procedure: SUBMUCOSAL INJECTION;  Surgeon: Willis Modena, MD;  Location: [facility];  Service: Endoscopy;;  Epinepherine    Family History  Problem Relation Age of Onset   Hypertension Mother    Cancer Father        not sure of what kind   Hypertension Maternal Aunt    Hypertension Paternal Aunt    Hypertension Maternal Grandmother    Sleep apnea Neg Hx     Social History   Socioeconomic History   Marital status: Married    Spouse name: John   Number of children: 1   Years of education: BS   Highest education level: Not on file  Occupational History    Employer: [employer]  Tobacco Use   Smoking status: Never   Smokeless tobacco: Never  Vaping Use   Vaping status: Never Used  Substance and Sexual Activity   Alcohol use: Yes    Comment: occasionally;   Drug use: No   Sexual activity: Yes  Other Topics Concern   Not on file  Social History Narrative   Patient lives at home with family. (husband and son),  Caffeine Use: 2-3 sodas weekly.  Works at [employer].   Social Determinants of Health   Financial Resource Strain: Not on file  Food Insecurity: Not on file  Transportation Needs: Not on file  Physical Activity: Not on file  Stress: Not on file  Social Connections: Not on file  Intimate Partner Violence: Not on file    Review of Systems  Respiratory:  Positive for shortness of breath.     Vitals:   08/19/23 1529  BP: 132/86  Pulse: 77  SpO2: 98%  Physical Exam Constitutional:      Appearance: She is obese.  HENT:     Head: Normocephalic.     Mouth/Throat:     Pharynx: Oropharynx is clear.  Eyes:     General: No scleral icterus. Cardiovascular:     Rate and Rhythm: Normal rate and regular rhythm.     Heart sounds: No murmur heard.    No friction rub.  Pulmonary:     Effort: No respiratory distress.     Breath sounds: No stridor. No wheezing or rhonchi.  Musculoskeletal:     Cervical back: No rigidity or tenderness.  Neurological:     Mental Status: She is alert.  Psychiatric:        Mood and Affect: Mood normal.    Data Reviewed: No recent chest x-ray  Sleep study report reviewed showing moderate obstructive sleep apnea  Assessment:  Obstructive sleep apnea -Encouraged to restart CPAP use -Encouraged to call the medical supply company to figure out how to use her CPAP without humidification  Class III obesity -Weight loss efforts encouraged  Does have history of arthritis -She states has been trying to exercise recently  Encouraged to continue  History of asthma with recurrent exacerbations -Does have  a prescription for Wixela  Plan/Recommendations: Encouraged to continue using Wixela  Prescription for albuterol sent to pharmacy  Encouraged to continue, get back to using CPAP on a nightly basis  Nontolerant to CPAP was discussed  Other options of treatment for sleep disordered breathing, risk with not treating sleep disordered breathing discussed with the patient  Follow-up in 3 months  Encouraged to call with significant concerns   Virl Diamond MD [facility] 08/19/2023, 3:37 PM  CC: Mila Palmer, MD

## 2023-08-28 ENCOUNTER — Ambulatory Visit: Payer: 59

## 2023-09-01 ENCOUNTER — Ambulatory Visit: Payer: Self-pay | Admitting: Cardiology

## 2023-09-10 ENCOUNTER — Ambulatory Visit: Payer: 59 | Admitting: Cardiology

## 2023-09-30 ENCOUNTER — Ambulatory Visit: Payer: 59 | Attending: Physician Assistant | Admitting: Emergency Medicine

## 2023-09-30 ENCOUNTER — Encounter: Payer: Self-pay | Admitting: Emergency Medicine

## 2023-09-30 VITALS — BP 128/76 | HR 72 | Ht 63.0 in | Wt 297.0 lb

## 2023-09-30 DIAGNOSIS — H9193 Unspecified hearing loss, bilateral: Secondary | ICD-10-CM

## 2023-09-30 DIAGNOSIS — I071 Rheumatic tricuspid insufficiency: Secondary | ICD-10-CM | POA: Diagnosis not present

## 2023-09-30 DIAGNOSIS — G4733 Obstructive sleep apnea (adult) (pediatric): Secondary | ICD-10-CM

## 2023-09-30 DIAGNOSIS — E7800 Pure hypercholesterolemia, unspecified: Secondary | ICD-10-CM

## 2023-09-30 DIAGNOSIS — I480 Paroxysmal atrial fibrillation: Secondary | ICD-10-CM

## 2023-09-30 DIAGNOSIS — I10 Essential (primary) hypertension: Secondary | ICD-10-CM | POA: Diagnosis not present

## 2023-09-30 NOTE — Progress Notes (Signed)
Cardiology Office Note:    Date:  09/30/2023  ID:  DAISEE FOULDS, DOB Dec 06, 1965, MRN [id number] PCP: Mila Palmer, MD  [facility] Providers Cardiologist:  Tessa Lerner, DO       Patient Profile:      Alexis Cook. Alexis Cook is a 57 year old female with history of atrial fibrillation, sleep apnea, asthma, hypertension, obesity, GERD.  She was originally seen in July 2022 for evaluation of palpitations.  She underwent a Holter monitor which showed paroxysmal atrial fibrillation with a burden of approximately 10%.  It was decided to proceed with rate control strategy and patient was started on Toprol-XL.  Patient chose to not start anticoagulation but did agree to start aspirin 81 mg.  She was diagnosed with OSA in 12/2022 she used CPAP for about a month and got off of it due to recurrent congestion.      History of Present Illness:   Alexis Cook is a 57 y.o. female who returns for her 1 year follow-up.  Today, patient is doing well overall.  She denies any cardiovascular complaints or concerns over the past year.  She notes cyst in her left breast that can sometimes cause her pain.  She notes that she does sweat often and was concerned that this could be related to CVD.  She notes that she has not had any atrial fibrillation attacks over the past year and has been asymptomatic.  She notes that she is not on anticoagulation because she feels like her stroke risk is low and she is young.  She does note that as she ages she gets more concerned regarding her stroke risk and would like to research possible blood thinning medications that we would start on prior to her starting them today.  She notes that she does not get much exercise but intends to begin exercising more often to lose weight.  She notes that her diet is OK but can definitely be improved, she is interested in decreasing her intake of processed foods.  She does note that she has had recent hearing loss that was noted at her  work.  She was concerned that her metoprolol could be causing this as she read this was a possible side effect online.  She does have an upcoming appointment to have her hearing checked.  She does note that she has had minimal lightheadedness over the past year.  Typically this happens when she moves too quickly but she denies any falls.  She denies chest pain, shortness of breath, lower extremity edema, fatigue, palpitations, melena, hematuria, hemoptysis, diaphoresis, weakness, presyncope, syncope, orthopnea, and PND.        Review of Systems  Constitutional: Negative for weight gain and weight loss.  HENT:  Positive for hearing loss. Negative for tinnitus.   Cardiovascular:  Negative for chest pain, claudication, cyanosis, dyspnea on exertion, irregular heartbeat, leg swelling, near-syncope, orthopnea, palpitations, paroxysmal nocturnal dyspnea and syncope.  Respiratory:  Negative for cough, hemoptysis and shortness of breath.   Gastrointestinal:  Negative for abdominal pain, hematochezia and melena.  Genitourinary:  Negative for hematuria.  Neurological:  Positive for light-headedness. Negative for dizziness and headaches.     See HPI    Studies Reviewed:   EKG Interpretation Date/Time:  Wednesday September 30 2023 09:29:01 EST Ventricular Rate:  72 PR Interval:  132 QRS Duration:  108 QT Interval:  374 QTC Calculation: 409 R Axis:   41  Text Interpretation: Normal sinus rhythm Nonspecific T wave abnormality Confirmed by  Rise Paganini [phone]) on 09/30/2023 9:59:11 AM    Lexiscan stress test 07/15/2021 ECG is normal.  Resting ECG shows no ST-segment deviation.  There were no arrhythmias during stress.  ECG was interpretable.  The ECG was negative for ischemia  CT cardiac scoring 06/12/2021 No visible coronary artery calcifications. Total coronary calcium score of 0. No acute or significant extracardiac abnormality.  7-day extended Holter monitor 04/2021 Dominant rhythm normal  sinus rhythm, followed by atrial fibrillation. Normal Sinus heart rate 55-138 bpm.  Avg HR 80 bpm. Atrial fibrillation heart rate 67 - 233 bpm, average heart rate 112 bpm. Atrial fibrillation burden 10% (9 hours 7 minutes) No ventricular tachycardia, high grade AV block, pauses (3 seconds or longer). Total ventricular ectopic burden <1%. Total supraventricular ectopic burden <1%. Patient triggered events: 18.  Underlying rhythm predominantly sinus followed by sinus tachycardia.  She had episodes of symptomatic atrial fibrillation (patient reported fluttering/racing on 05/07/2021).  Echocardiogram 05/02/2021 Left ventricle cavity is normal in size and wall thickness. Normal global  wall motion. Normal LV systolic function with EF 66%. Normal diastolic  filling pattern.  Mild tricuspid regurgitation  No evidence of pulmonary hypertension.  Risk Assessment/Calculations:    CHA2DS2-VASc Score = 2   This indicates a 2.2% annual risk of stroke. The patient's score is based upon: CHF History: 0 HTN History: 1 Diabetes History: 0 Stroke History: 0 Vascular Disease History: 0 Age Score: 0 Gender Score: 1            Physical Exam:   VS:  BP 128/76   Pulse 72   Ht 5\' 3"  (1.6 m)   Wt 297 lb (134.7 kg)   SpO2 96%   BMI 52.61 kg/m    Wt Readings from Last 3 Encounters:  09/30/23 297 lb (134.7 kg)  08/19/23 298 lb 3.2 oz (135.3 kg)  04/14/23 296 lb (134.3 kg)    Constitutional:      Appearance: Normal and healthy appearance.  HENT:     Head: Normocephalic.  Neck:     Vascular: JVD normal.  Pulmonary:     Effort: Pulmonary effort is normal.     Breath sounds: Normal breath sounds.  Chest:     Chest wall: Not tender to palpatation.  Cardiovascular:     PMI at left midclavicular line. Normal rate. Regular rhythm. Normal S1. Normal S2.      Murmurs: There is no murmur.     No gallop.  No click. No rub.  Pulses:    Intact distal pulses.  Edema:    Peripheral edema absent.   Musculoskeletal: Normal range of motion.     Cervical back: Normal range of motion and neck supple. Skin:    General: Skin is warm and dry.  Neurological:     General: No focal deficit present.     Mental Status: Alert, oriented to person, place, and time and oriented to person, place and time.  Psychiatric:        Attention and Perception: Attention and perception normal.        Mood and Affect: Mood normal.        Behavior: Behavior is cooperative.        Thought Content: Thought content normal.        Assessment and Plan:  Paroxysmal atrial fibrillation -CHA2DS2-VASc Score = 2 [CHF History: 0, HTN History: 1, Diabetes History: 0, Stroke History: 0, Vascular Disease History: 0, Age Score: 0, Gender Score: 1].  Therefore, the patient's annual risk  of stroke is 2.2 %.     -Discovered July 2022.  7-day Holter monitor on 04/2021 with atrial fibrillation burden 10% -She is asymptomatic her rates have been controlled.  EKG today NSR. -We had a lengthy discussion regarding OAC therapy given her elevated stroke risk.  I have recommended that she start [facility] therapy today.  She politely declined and prefers to educate herself further on OACs such as Eliquis and Xarelto.  She will call back to the office if she would like to begin anticoagulation therapy.  I discussed the role of OAC with regards to thromboembolic prophylaxis as well as side effects, risk, and alternatives of therapy -She chooses to be on aspirin 81 mg daily for now. If she elects to go onto a DOAC, would discontinue this. -Continue metoprolol succinate 25 mg once daily, aspirin 81 mg once daily  Hypertension -BP today 120/76.  Under excellent control -Continue losartan-hydrochlorothiazide 160-25 mg, metoprolol succinate 25 mg -Encouraged 150 minutes of moderate intensity aerobic exercise weekly  Mild tricuspid regurgitation -Echocardiogram 05/02/2021 with EF 66%, with mild TR -Will continue with surveillance, repeat  echocardiogram today  Obstructive sleep apnea -Has been off of CPAP over the past month due to congestion only when she wears the device -She has been in contact with manufacturer to get filter to help with this -She does clean CPAP daily but plans to get bluelight to continue to clean machine -We discussed importance of CPAP compliance with regards to CVD  Obesity -BMI 52.61 -We discussed importance of weight loss with regards to her overall health -Encouraged heart healthy/Mediterranean diet.  Increase fiber, fruits, vegetables in diet.  Decrease processed foods and foods high in sugar -She should discuss with PCP regarding possible GLP-1 therapy  Elevated LDL -LDL 103 on 02/06/2023.  Repeat lipid panel today -CT cardiac scoring on 06/12/2021 with coronary calcium score 0.  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  She does see an audiologist soon.  -She was concerned that metoprolol could be causing this.  Metoprolol does not have hearing also has a common side effect everywhere he can be associate with tinnitus.  Patient's symptoms do not correlate with tinnitus at this time -Will await her for her audio appointment and we can address at upcoming visit              Dispo:  Return in about 6 months (around 03/30/2024).  Signed, Denyce Robert, NP

## 2023-09-30 NOTE — Patient Instructions (Signed)
Medication Instructions:   Your physician recommends that you continue on your current medications as directed. Please refer to the Current Medication list given to you today.   *If you need a refill on your cardiac medications before your next appointment, please call your pharmacy*   Lab Work:   PLEASE GO DOWN STAIRS FIRST FLOOR  [address] :  CMET LIPIDS AND CBC  TODAY     If you have labs (blood work) drawn today and your tests are completely normal, you will receive your results only by: MyChart Message (if you have MyChart) OR A paper copy in the mail If you have any lab test that is abnormal or we need to change your treatment, we will call you to review the results.   Testing/Procedures: Your physician has requested that you have an echocardiogram. Echocardiography is a painless test that uses sound waves to create images of your heart. It provides your doctor with information about the size and shape of your heart and how well your heart's chambers and valves are working. This procedure takes approximately one hour. There are no restrictions for this procedure. Please do NOT wear cologne, perfume, aftershave, or lotions (deodorant is allowed). Please arrive 15 minutes prior to your appointment time.  Please note: We ask at that you not bring children with you during ultrasound (echo/ vascular) testing. Due to room size and safety concerns, children are not allowed in the ultrasound rooms during exams. Our front office staff cannot provide observation of children in our lobby area while testing is being conducted. An adult accompanying a patient to their appointment will only be allowed in the ultrasound room at the discretion of the ultrasound technician under special circumstances. We apologize for any inconvenience.    Follow-Up: At [facility], you and your health needs are our priority.  As part of our continuing mission to provide you with exceptional heart care, we  have created designated Provider Care Teams.  These Care Teams include your primary Cardiologist (physician) and Advanced Practice Providers (APPs -  Physician Assistants and Nurse Practitioners) who all work together to provide you with the care you need, when you need it.  We recommend signing up for the patient portal called "MyChart".  Sign up information is provided on this After Visit Summary.  MyChart is used to connect with patients for Virtual Visits (Telemedicine).  Patients are able to view lab/test results, encounter notes, upcoming appointments, etc.  Non-urgent messages can be sent to your provider as well.   To learn more about what you can do with MyChart, go to [email or web address].    Your next appointment:   6 month(s)  Provider:    DR Odis Hollingshead   Other Instructions

## 2023-10-01 ENCOUNTER — Telehealth: Payer: Self-pay

## 2023-10-01 LAB — COMPREHENSIVE METABOLIC PANEL
ALT: 14 [IU]/L (ref 0–32)
AST: 12 [IU]/L (ref 0–40)
Albumin: 3.7 g/dL — ABNORMAL LOW (ref 3.8–4.9)
Alkaline Phosphatase: 58 [IU]/L (ref 44–121)
BUN/Creatinine Ratio: 18 (ref 9–23)
BUN: 13 mg/dL (ref 6–24)
Bilirubin Total: 0.2 mg/dL (ref 0.0–1.2)
CO2: 23 mmol/L (ref 20–29)
Calcium: 9.4 mg/dL (ref 8.7–10.2)
Chloride: 105 mmol/L (ref 96–106)
Creatinine, Ser: 0.73 mg/dL (ref 0.57–1.00)
Globulin, Total: 2.6 g/dL (ref 1.5–4.5)
Glucose: 86 mg/dL (ref 70–99)
Potassium: 4 mmol/L (ref 3.5–5.2)
Sodium: 140 mmol/L (ref 134–144)
Total Protein: 6.3 g/dL (ref 6.0–8.5)
eGFR: 97 mL/min/{1.73_m2} (ref >59)

## 2023-10-01 LAB — LIPID PANEL
Chol/HDL Ratio: 2.9 {ratio} (ref 0.0–4.4)
Cholesterol, Total: 159 mg/dL (ref 100–199)
HDL: 54 mg/dL (ref >39)
LDL Chol Calc (NIH): 93 mg/dL (ref 0–99)
Triglycerides: 57 mg/dL (ref 0–149)
VLDL Cholesterol Cal: 12 mg/dL (ref 5–40)

## 2023-10-01 LAB — CBC
Hematocrit: 42.1 % (ref 34.0–46.6)
Hemoglobin: 13.5 g/dL (ref 11.1–15.9)
MCH: 29.6 pg (ref 26.6–33.0)
MCHC: 32.1 g/dL (ref 31.5–35.7)
MCV: 92 fL (ref 79–97)
Platelets: 315 10*3/uL (ref 150–450)
RBC: 4.56 x10E6/uL (ref 3.77–5.28)
RDW: 12.8 % (ref 11.7–15.4)
WBC: 7.4 10*3/uL (ref 3.4–10.8)

## 2023-10-01 NOTE — Telephone Encounter (Signed)
-----   Message from Denyce Robert sent at 10/01/2023  9:06 AM EST ----- Your comprehensive metabolic panel and complete blood count are unremarkable and stable.  Your cholesterol panel is under good control.  Your LDL cholesterol (bad cholesterol) is 93 which is below your goal of 100.  Good result!

## 2023-10-01 NOTE — Telephone Encounter (Signed)
Mailbox full

## 2023-10-22 ENCOUNTER — Ambulatory Visit: Payer: 59 | Admitting: Neurology

## 2023-10-26 ENCOUNTER — Telehealth: Payer: Self-pay | Admitting: Neurology

## 2023-10-26 NOTE — Telephone Encounter (Signed)
 Pt r/s due to weather. Wait listed

## 2023-10-27 ENCOUNTER — Ambulatory Visit: Payer: 59 | Admitting: Neurology

## 2023-11-06 ENCOUNTER — Ambulatory Visit (HOSPITAL_COMMUNITY): Payer: 59 | Attending: Cardiology

## 2023-11-06 DIAGNOSIS — I071 Rheumatic tricuspid insufficiency: Secondary | ICD-10-CM | POA: Insufficient documentation

## 2023-11-06 LAB — ECHOCARDIOGRAM COMPLETE
AR max vel: 2.94 cm2
AV Area VTI: 2.86 cm2
AV Area mean vel: 2.83 cm2
AV Mean grad: 6.5 mm[Hg]
AV Peak grad: 13 mm[Hg]
Ao pk vel: 1.81 m/s
Area-P 1/2: 3.66 cm2
S' Lateral: 3.1 cm

## 2023-11-12 ENCOUNTER — Other Ambulatory Visit: Payer: Self-pay

## 2023-11-12 DIAGNOSIS — I480 Paroxysmal atrial fibrillation: Secondary | ICD-10-CM

## 2023-11-12 DIAGNOSIS — R002 Palpitations: Secondary | ICD-10-CM

## 2023-11-12 MED ORDER — ASPIRIN 81 MG PO TBEC: DELAYED_RELEASE_TABLET | ORAL | 3 refills | Status: DC

## 2023-11-12 MED ORDER — METOPROLOL SUCCINATE ER 25 MG PO TB24: ORAL_TABLET | ORAL | 3 refills | Status: DC

## 2023-12-21 ENCOUNTER — Ambulatory Visit: Payer: 59 | Admitting: Pulmonary Disease

## 2023-12-21 ENCOUNTER — Encounter: Payer: Self-pay | Admitting: Pulmonary Disease

## 2023-12-21 VITALS — BP 128/82 | HR 75 | Ht 63.0 in | Wt 300.0 lb

## 2023-12-21 DIAGNOSIS — R0602 Shortness of breath: Secondary | ICD-10-CM

## 2023-12-21 DIAGNOSIS — G4733 Obstructive sleep apnea (adult) (pediatric): Secondary | ICD-10-CM

## 2023-12-21 NOTE — Progress Notes (Signed)
 Alexis Cook    [id number]    29-Jan-1966  Primary Care Physician:Wolters, Jasmine December, MD  Referring Physician: Mila Palmer, MD [address] [place],  [place] [address]  Chief complaint:   Follow-up for shortness of breath  HPI:  Shortness of breath with activity Usually depends on the pace of activity but does get short of breath, sweaty  Has postnasal drip sometimes  She feels she started having more infections since she started using CPAP therapy  Did have a history of asthma growing up She still does have occasional wheezing  She uses a nebulizer about twice a day, this does help her breathing  History of hypertension Allergy and sinus problems  Diagnosed with obstructive sleep apnea in March-moderate obstructive sleep apnea She used CPAP for about a month and then got off using it because she was having recurrent congestion  Never smoker-social smoking when she was much younger No significant secondhand smoke exposure  No pertinent occupational history   Outpatient Encounter Medications as of 12/21/2023  Medication Sig   acetaminophen (TYLENOL) 650 MG CR tablet Take 650 mg by mouth every 8 (eight) hours as needed for pain.   albuterol (PROVENTIL HFA;VENTOLIN HFA) 108 (90 BASE) MCG/ACT inhaler Inhale 2 puffs into the lungs every 6 (six) hours as needed for wheezing or shortness of breath.    albuterol (PROVENTIL) (2.5 MG/3ML) 0.083% nebulizer solution Take 2.5 mg by nebulization every 6 (six) hours as needed for wheezing or shortness of breath.    albuterol (VENTOLIN HFA) 108 (90 Base) MCG/ACT inhaler Inhale 2 puffs into the lungs every 6 (six) hours as needed for wheezing or shortness of breath.   aspirin EC ([employer] ASPIRIN LOW DOSE) 81 MG tablet TAKE 1 WHOLE TABLET BY MOUTH DAILY   cetirizine (ZYRTEC) 10 MG tablet Take 10 mg by mouth daily as needed for allergies.   cholecalciferol (VITAMIN D3) 25 MCG (1000 UNIT) tablet Take 1,000 Units by  mouth daily.   diclofenac Sodium (VOLTAREN) 1 % GEL Apply 2 g topically as needed (Pain).   DULoxetine (CYMBALTA) 30 MG capsule Take 30 mg by mouth at bedtime.   famotidine (PEPCID) 40 MG tablet Take 40 mg by mouth daily as needed for heartburn or indigestion.   fluocinonide ointment (LIDEX) 0.05 % Apply 1 Application topically 2 (two) times daily as needed (Eczema).   guaiFENesin-codeine (ROBITUSSIN AC) 100-10 MG/5ML syrup Take 5 mLs by mouth 3 (three) times daily as needed for cough.   ibuprofen (ADVIL) 800 MG tablet Take 800 mg by mouth daily.   levonorgestrel-ethinyl estradiol (SEASONALE,INTROVALE,JOLESSA) 0.15-0.03 MG tablet Take 1 tablet by mouth daily.   metoprolol succinate (TOPROL-XL) 25 MG 24 hr tablet TAKE 1 TABLET BY MOUTH EVERY DAY IN THE MORNING   morphine (KADIAN) 20 MG 24 hr capsule Take 20 mg by mouth every morning.   Multiple Vitamins-Minerals (MULTIVITAMIN WITH MINERALS) tablet Take 1 tablet by mouth daily. 50+   Respiratory Therapy Supplies (AIRS DISPOSABLE NEBULIZER) KIT    valsartan-hydrochlorothiazide (DIOVAN-HCT) 160-25 MG per tablet Take 1 tablet by mouth daily.    No facility-administered encounter medications on file as of 12/21/2023.    Allergies as of 12/21/2023 - Review Complete 12/21/2023  Allergen Reaction Noted   Cortisone Other (See Comments) 11/24/2014   Hydrocodone Itching 09/22/2018   Lisinopril Other (See Comments) 08/27/2022   Oxycodone-acetaminophen Nausea Only 08/27/2022   Pantoprazole Other (See Comments) 08/27/2022   Rofecoxib  01/09/2010  Tramadol Other (See Comments) 08/27/2022    Past Medical History:  Diagnosis Date   Anxiety and depression    Arthritis    knee pain-uses crutches at times in house   Asthma    as child and occ   Back pain    Dysrhythmia    hx of Afib   GERD (gastroesophageal reflux disease)    Hx of migraines    Hypertension    Obesity    PONV (postoperative nausea and vomiting)    Sleep apnea    mild-no cpap  needed    Past Surgical History:  Procedure Laterality Date   BIOPSY  09/24/2022   Procedure: BIOPSY;  Surgeon: Willis Modena, MD;  Location: [facility];  Service: Gastroenterology;;   Fidela Salisbury RELEASE  03/09/2012   Procedure: CARPAL TUNNEL RELEASE;  Surgeon: Nicki Reaper, MD;  Location: [facility];  Service: Orthopedics;  Laterality: Right;   CESAREAN SECTION     CHOLECYSTECTOMY     COLONOSCOPY WITH PROPOFOL N/A 09/22/2018   Procedure: COLONOSCOPY WITH PROPOFOL;  Surgeon: Willis Modena, MD;  Location: [facility];  Service: Endoscopy;  Laterality: N/A;   COLONOSCOPY WITH PROPOFOL N/A 09/24/2022   Procedure: COLONOSCOPY WITH PROPOFOL;  Surgeon: Willis Modena, MD;  Location: [facility];  Service: Gastroenterology;  Laterality: N/A;   ESOPHAGOGASTRODUODENOSCOPY N/A 09/24/2022   Procedure: ESOPHAGOGASTRODUODENOSCOPY (EGD);  Surgeon: Willis Modena, MD;  Location: [facility];  Service: Gastroenterology;  Laterality: N/A;   HERNIA REPAIR     POLYPECTOMY  09/22/2018   Procedure: POLYPECTOMY;  Surgeon: Willis Modena, MD;  Location: [facility];  Service: Endoscopy;;   SUBMUCOSAL INJECTION  09/22/2018   Procedure: SUBMUCOSAL INJECTION;  Surgeon: Willis Modena, MD;  Location: [facility];  Service: Endoscopy;;  Epinepherine    Family History  Problem Relation Age of Onset   Hypertension Mother    Cancer Father        not sure of what kind   Hypertension Maternal Aunt    Hypertension Paternal Aunt    Hypertension Maternal Grandmother    Sleep apnea Neg Hx     Social History   Socioeconomic History   Marital status: Married    Spouse name: John   Number of children: 1   Years of education: BS   Highest education level: Not on file  Occupational History    Employer: [employer]  Tobacco Use   Smoking status: Never   Smokeless tobacco: Never  Vaping Use   Vaping status: Never Used  Substance and Sexual Activity   Alcohol use: Yes     Comment: occasionally;   Drug use: No   Sexual activity: Yes  Other Topics Concern   Not on file  Social History Narrative   Patient lives at home with family. (husband and son),  Caffeine Use: 2-3 sodas weekly.  Works at [employer].   Social Drivers of Corporate investment banker Strain: Not on file  Food Insecurity: Not on file  Transportation Needs: Not on file  Physical Activity: Not on file  Stress: Not on file  Social Connections: Not on file  Intimate Partner Violence: Not on file    Review of Systems  Respiratory:  Positive for shortness of breath.     Vitals:   12/21/23 1548  BP: 128/82  Pulse: 75  SpO2: 97%     Physical Exam Constitutional:      Appearance: She is obese.  HENT:     Head: Normocephalic.  Mouth/Throat:     Pharynx: Oropharynx is clear.  Eyes:     General: No scleral icterus. Cardiovascular:     Rate and Rhythm: Normal rate and regular rhythm.     Heart sounds: No murmur heard.    No friction rub.  Pulmonary:     Effort: No respiratory distress.     Breath sounds: No stridor. No wheezing or rhonchi.  Musculoskeletal:     Cervical back: No rigidity or tenderness.  Neurological:     Mental Status: She is alert.  Psychiatric:        Mood and Affect: Mood normal.    Data Reviewed: No recent chest x-ray  Sleep study report reviewed showing moderate obstructive sleep apnea  Assessment:  Obstructive sleep apnea -Has not been back to using CPAP regularly -Did receive supplies -Plans to get back to using CPAP  Class III obesity -Weight loss efforts encouraged  History of arthritis -Pain management  History of asthma -Continue albuterol as needed -Encouraged to start using Wixela  Plan/Recommendations: Encouraged to use Wixela  Albuterol as needed  Graded activities as tolerated  Encouraged to go back to using CPAP on a nightly basis  Follow-up in about 3 months  Further discussions with primary doctor about weight  loss injections  I spent 30 minutes dedicated to the care of this patient on the date of this encounter to include previsit review of records, face-to-face time with the patient discussing conditions above, post visit ordering of testing,ordering medications,independentlyinterpreting results, clinical documentation with electronic health record and communicated necessary findings to members of the patient's care team  Virl Diamond MD [facility] 12/21/2023, 3:54 PM  CC: Mila Palmer, MD

## 2023-12-21 NOTE — Patient Instructions (Addendum)
 I will see you back in 3 months  Graded activities as tolerated  Wixela to be used twice a day  Yes use your albuterol as needed  Try and get back to using the CPAP  Talk with your primary doctor again about weight loss injections  Call us with significant concerns  Yeah we will see her back in 3 months

## 2024-01-07 ENCOUNTER — Ambulatory Visit: Payer: 59 | Admitting: Neurology

## 2024-02-11 ENCOUNTER — Other Ambulatory Visit (HOSPITAL_COMMUNITY)
Admission: RE | Admit: 2024-02-11 | Discharge: 2024-02-11 | Disposition: A | Discharge status: Home / Self Care | Source: Ambulatory Visit | Attending: Family Medicine | Admitting: Family Medicine

## 2024-02-11 ENCOUNTER — Other Ambulatory Visit: Payer: Self-pay | Admitting: Family Medicine

## 2024-02-11 DIAGNOSIS — Z01411 Encounter for gynecological examination (general) (routine) with abnormal findings: Secondary | ICD-10-CM | POA: Insufficient documentation

## 2024-02-16 LAB — CYTOLOGY - PAP
Adequacy: ABSENT
Comment: NEGATIVE
Diagnosis: NEGATIVE
High risk HPV: NEGATIVE

## 2024-03-23 ENCOUNTER — Ambulatory Visit: Admitting: Pulmonary Disease

## 2024-05-31 ENCOUNTER — Other Ambulatory Visit: Payer: Self-pay | Admitting: Family Medicine

## 2024-05-31 DIAGNOSIS — Z1231 Encounter for screening mammogram for malignant neoplasm of breast: Secondary | ICD-10-CM

## 2024-07-18 ENCOUNTER — Ambulatory Visit
Admission: RE | Admit: 2024-07-18 | Discharge: 2024-07-18 | Disposition: A | Discharge status: Home / Self Care | Source: Ambulatory Visit | Attending: Family Medicine | Admitting: Family Medicine

## 2024-07-18 DIAGNOSIS — Z1231 Encounter for screening mammogram for malignant neoplasm of breast: Secondary | ICD-10-CM

## 2024-11-02 ENCOUNTER — Other Ambulatory Visit: Payer: Self-pay | Admitting: Emergency Medicine

## 2024-11-02 DIAGNOSIS — R002 Palpitations: Secondary | ICD-10-CM

## 2024-11-02 DIAGNOSIS — I480 Paroxysmal atrial fibrillation: Secondary | ICD-10-CM
